# Patient Record
Sex: Female | Born: 1975 | Race: White | Hispanic: No | Marital: Married | Smoking: Current every day smoker
Health system: Southern US, Community
[De-identification: ages and names within clinical notes are randomized; demographics above are authoritative.]

## PROBLEM LIST (undated history)

## (undated) DIAGNOSIS — R112 Nausea with vomiting, unspecified: Secondary | ICD-10-CM

## (undated) DIAGNOSIS — D649 Anemia, unspecified: Secondary | ICD-10-CM

## (undated) DIAGNOSIS — F419 Anxiety disorder, unspecified: Secondary | ICD-10-CM

## (undated) DIAGNOSIS — F32A Depression, unspecified: Secondary | ICD-10-CM

## (undated) DIAGNOSIS — F329 Major depressive disorder, single episode, unspecified: Secondary | ICD-10-CM

## (undated) DIAGNOSIS — Z9889 Other specified postprocedural states: Secondary | ICD-10-CM

## (undated) DIAGNOSIS — J302 Other seasonal allergic rhinitis: Secondary | ICD-10-CM

## (undated) HISTORY — PX: DILATION AND CURETTAGE OF UTERUS: SHX78

---
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
## 2012-05-07 ENCOUNTER — Encounter: Payer: BC Managed Care – PPO | Attending: "Endocrinology | Admitting: *Deleted

## 2012-05-07 DIAGNOSIS — Z713 Dietary counseling and surveillance: Secondary | ICD-10-CM

## 2012-05-08 NOTE — Progress Notes (Signed)
Patient attended basic nutrition class on 05/07/12.  Topics covered include:   1. Complications of Hyperlipidemia and/or Hypertension. 2. Ways to reduce risk of heart disease.  3. Identifying fat and sodium content on food labels. 4. Ways to decrease sodium intake. 5. Optimal amount of daily saturated fat intake. 6. Optimal amount of daily sodium intake.  7. Foods to limit/avoid on a heart healthy diet. 8. MyPlate and portion control.   Patient to follow-up with [facility] prn.  

## 2012-11-22 ENCOUNTER — Encounter (HOSPITAL_BASED_OUTPATIENT_CLINIC_OR_DEPARTMENT_OTHER): Payer: Self-pay | Admitting: Emergency Medicine

## 2012-11-22 DIAGNOSIS — F17200 Nicotine dependence, unspecified, uncomplicated: Secondary | ICD-10-CM | POA: Insufficient documentation

## 2012-11-22 DIAGNOSIS — Z3202 Encounter for pregnancy test, result negative: Secondary | ICD-10-CM | POA: Insufficient documentation

## 2012-11-22 DIAGNOSIS — R109 Unspecified abdominal pain: Secondary | ICD-10-CM | POA: Insufficient documentation

## 2012-11-22 DIAGNOSIS — N898 Other specified noninflammatory disorders of vagina: Secondary | ICD-10-CM | POA: Insufficient documentation

## 2012-11-22 DIAGNOSIS — Z79899 Other long term (current) drug therapy: Secondary | ICD-10-CM | POA: Insufficient documentation

## 2012-11-22 NOTE — ED Notes (Signed)
Pt c/o right sided and suprapubic abd pain pain starting tonight. Pt reports vomiting x several episodes tonight.

## 2012-11-23 ENCOUNTER — Emergency Department (HOSPITAL_BASED_OUTPATIENT_CLINIC_OR_DEPARTMENT_OTHER)
Admission: EM | Admit: 2012-11-23 | Discharge: 2012-11-23 | Disposition: A | Discharge status: Home / Self Care | Payer: BC Managed Care – PPO | Attending: Emergency Medicine | Admitting: Emergency Medicine

## 2012-11-23 ENCOUNTER — Ambulatory Visit (HOSPITAL_BASED_OUTPATIENT_CLINIC_OR_DEPARTMENT_OTHER)
Admit: 2012-11-23 | Discharge: 2012-11-23 | Disposition: A | Discharge status: Home / Self Care | Payer: BC Managed Care – PPO | Attending: Emergency Medicine | Admitting: Emergency Medicine

## 2012-11-23 ENCOUNTER — Emergency Department (HOSPITAL_BASED_OUTPATIENT_CLINIC_OR_DEPARTMENT_OTHER): Payer: BC Managed Care – PPO

## 2012-11-23 DIAGNOSIS — R109 Unspecified abdominal pain: Secondary | ICD-10-CM

## 2012-11-23 LAB — URINALYSIS, ROUTINE W REFLEX MICROSCOPIC
Bilirubin Urine: NEGATIVE
Glucose, UA: NEGATIVE mg/dL
Hgb urine dipstick: NEGATIVE
Ketones, ur: NEGATIVE mg/dL
Leukocytes, UA: NEGATIVE
Nitrite: NEGATIVE
Protein, ur: NEGATIVE mg/dL
Specific Gravity, Urine: 1.027 (ref 1.005–1.030)
Urobilinogen, UA: 0.2 mg/dL (ref 0.0–1.0)
pH: 5 (ref 5.0–8.0)

## 2012-11-23 LAB — WET PREP, GENITAL
Clue Cells Wet Prep HPF POC: NONE SEEN
Trich, Wet Prep: NONE SEEN

## 2012-11-23 LAB — CBC WITH DIFFERENTIAL/PLATELET
Basophils Relative: 0 % (ref 0–1)
Eosinophils Absolute: 0.3 10*3/uL (ref 0.0–0.7)
Hemoglobin: 12.8 g/dL (ref 12.0–15.0)
Lymphs Abs: 3.1 10*3/uL (ref 0.7–4.0)
MCH: 29 pg (ref 26.0–34.0)
Monocytes Relative: 5 % (ref 3–12)
Neutro Abs: 9.3 10*3/uL — ABNORMAL HIGH (ref 1.7–7.7)
Neutrophils Relative %: 69 % (ref 43–77)
Platelets: 302 10*3/uL (ref 150–400)
RBC: 4.41 MIL/uL (ref 3.87–5.11)

## 2012-11-23 LAB — PREGNANCY, URINE: Preg Test, Ur: NEGATIVE

## 2012-11-23 LAB — BASIC METABOLIC PANEL
BUN: 12 mg/dL (ref 6–23)
Chloride: 101 mEq/L (ref 96–112)
GFR calc Af Amer: >90 mL/min (ref >90)
GFR calc non Af Amer: 81 mL/min — ABNORMAL LOW (ref >90)
Glucose, Bld: 104 mg/dL — ABNORMAL HIGH (ref 70–99)
Potassium: 4.2 mEq/L (ref 3.5–5.1)
Sodium: 137 mEq/L (ref 135–145)

## 2012-11-23 MED ORDER — FENTANYL CITRATE 0.05 MG/ML IJ SOLN
100.0000 ug | Freq: Once | INTRAMUSCULAR | Status: AC
  Administered 2012-11-23: 100 ug via INTRAVENOUS
  Filled 2012-11-23: qty 2

## 2012-11-23 MED ORDER — OXYCODONE-ACETAMINOPHEN 5-325 MG PO TABS: 1.0000 | ORAL_TABLET | Freq: Four times a day (QID) | ORAL | Status: DC | PRN

## 2012-11-23 MED ORDER — IOHEXOL 300 MG/ML  SOLN
100.0000 mL | Freq: Once | INTRAMUSCULAR | Status: AC | PRN
  Administered 2012-11-23: 100 mL via INTRAVENOUS

## 2012-11-23 MED ORDER — IOHEXOL 300 MG/ML  SOLN
50.0000 mL | Freq: Once | INTRAMUSCULAR | Status: AC | PRN
  Administered 2012-11-23: 50 mL via ORAL

## 2012-11-23 NOTE — ED Provider Notes (Addendum)
History     CSN: [id number]  Arrival date & time 11/22/12  2355   First MD Initiated Contact with Patient 11/23/12 0028      Chief Complaint  Patient presents with  . Abdominal Pain    (Consider location/radiation/quality/duration/timing/severity/associated sxs/prior treatment) Patient is a 36 y.o. female presenting with abdominal pain. The history is provided by the patient.  Abdominal Pain The primary symptoms of the illness include abdominal pain. The primary symptoms of the illness do not include nausea or vomiting. The current episode started 1 to 2 hours ago (pain started then got worse and then has gotten a little better on own). The onset of the illness was sudden. The problem has not changed since onset. The abdominal pain began 1 to 2 hours ago. The pain came on suddenly. The abdominal pain has been unchanged since its onset. The abdominal pain is located in the RLQ and right flank. The abdominal pain does not radiate. The severity of the abdominal pain is 7/10 (started got worse now some improved). The abdominal pain is relieved by nothing. Exacerbated by: nothing.  The patient states that she believes she is currently not pregnant. The patient has not had a change in bowel habit. Risk factors for an acute abdominal problem include a history of abdominal surgery. Symptoms associated with the illness do not include chills. Significant associated medical issues do not include inflammatory bowel disease.    History reviewed. No pertinent past medical history.  Past Surgical History  Procedure Date  . Cesarean section     No family history on file.  History  Substance Use Topics  . Smoking status: Current Every Day Smoker  . Smokeless tobacco: Not on file  . Alcohol Use: No    OB History    Grav Para Term Preterm Abortions TAB SAB Ect Mult Living                  Review of Systems  Constitutional: Negative for chills.  Cardiovascular: Negative for chest pain.   Gastrointestinal: Positive for abdominal pain. Negative for nausea and vomiting.  All other systems reviewed and are negative.    Allergies  Augmentin; Penicillins; and Zyrtec  Home Medications   Current Outpatient Rx  Name  Route  Sig  Dispense  Refill  . CITALOPRAM HYDROBROMIDE 20 MG PO TABS   Oral   Take 20 mg by mouth daily.         Marland Kitchen MONTELUKAST SODIUM 10 MG PO TABS   Oral   Take 10 mg by mouth at bedtime.         . NORETHINDRONE ACET-ETHINYL EST 1-20 MG-MCG PO TABS   Oral   Take 1 tablet by mouth daily.           BP 142/85  Pulse 95  Temp 98.8 F (37.1 C) (Oral)  Resp 20  Ht 5' 3.5" (1.613 m)  Wt 230 lb (104.327 kg)  BMI 40.10 kg/m2  SpO2 97%  Physical Exam  Constitutional: She is oriented to person, place, and time. She appears well-developed and well-nourished. No distress.       Not writhing in room at time of exam.    HENT:  Head: Normocephalic and atraumatic.  Mouth/Throat: Oropharynx is clear and moist.  Eyes: Conjunctivae normal are normal. Pupils are equal, round, and reactive to light.  Neck: Normal range of motion. Neck supple.  Cardiovascular: Normal rate, regular rhythm and intact distal pulses.   Pulmonary/Chest: Effort normal and  breath sounds normal. She has no wheezes. She has no rales.  Abdominal: Soft. Bowel sounds are normal. She exhibits no mass. There is tenderness. There is no rebound and no guarding.  Genitourinary: Right adnexum displays no mass and no tenderness. Left adnexum displays no mass and no tenderness. Vaginal discharge found.       No CMT  Musculoskeletal: Normal range of motion. She exhibits no edema.  Neurological: She is alert and oriented to person, place, and time.  Skin: Skin is warm and dry.  Psychiatric: She has a normal mood and affect.    ED Course  Procedures (including critical care time)  Labs Reviewed  WET PREP, GENITAL - Abnormal; Notable for the following:    WBC, Wet Prep HPF POC FEW (*)      All other components within normal limits  URINALYSIS, ROUTINE W REFLEX MICROSCOPIC  PREGNANCY, URINE  CBC WITH DIFFERENTIAL  BASIC METABOLIC PANEL  GC/CHLAMYDIA PROBE AMP   No results found.   No diagnosis found.    MDM  Suspect cyst rupture given presentation and exam torsion extremely unlikely  255 case d/w Dr. Despina Hidden.  Not torsion based on exam and CT findings.  No indication for transfer nor emergent ultrasound.  Should have seen specific findings on CT scan.    And patient should be writhing in pain on exam.    Will schedule outpatient pelvic US.  Patient is amenable to same.  Currently feeling pain free return to the closest ED immediately for worsening symptoms.  Patient verbalizes understanding and agrees to follow up     [place] K Dan Dissinger-Rasch, MD 11/23/12 0328  Perrion Diesel K Matrice Herro-Rasch, MD 11/23/12 [phone]

## 2012-11-23 NOTE — ED Notes (Signed)
Pt. Reports the pain was so intense at her home that she vomited.

## 2017-07-30 ENCOUNTER — Other Ambulatory Visit: Payer: Self-pay | Admitting: Family Medicine

## 2017-07-30 ENCOUNTER — Ambulatory Visit
Admission: RE | Admit: 2017-07-30 | Discharge: 2017-07-30 | Disposition: A | Discharge status: Home / Self Care | Payer: BLUE CROSS/BLUE SHIELD | Source: Ambulatory Visit | Attending: Family Medicine | Admitting: Family Medicine

## 2017-07-30 DIAGNOSIS — R059 Cough, unspecified: Secondary | ICD-10-CM

## 2017-07-30 DIAGNOSIS — R0602 Shortness of breath: Secondary | ICD-10-CM

## 2017-07-30 DIAGNOSIS — R05 Cough: Secondary | ICD-10-CM

## 2017-09-04 ENCOUNTER — Other Ambulatory Visit: Payer: Self-pay | Admitting: Obstetrics and Gynecology

## 2017-09-04 DIAGNOSIS — N6320 Unspecified lump in the left breast, unspecified quadrant: Secondary | ICD-10-CM

## 2017-09-07 ENCOUNTER — Ambulatory Visit
Admission: RE | Admit: 2017-09-07 | Discharge: 2017-09-07 | Disposition: A | Discharge status: Home / Self Care | Payer: BLUE CROSS/BLUE SHIELD | Source: Ambulatory Visit | Attending: Obstetrics and Gynecology | Admitting: Obstetrics and Gynecology

## 2017-09-07 DIAGNOSIS — N6320 Unspecified lump in the left breast, unspecified quadrant: Secondary | ICD-10-CM

## 2017-10-04 HISTORY — PX: ABLATION: SHX5711

## 2018-05-26 IMAGING — US ULTRASOUND LEFT BREAST LIMITED
1 series · 4 of 4 positions shown · non-contrast
Comparison: Previous exam(s).

CLINICAL DATA: Mass felt by the patient in the 2 o'clock position
of the left breast for the past 2 months, unchanged during that
time.

EXAM:
2D DIGITAL DIAGNOSTIC BILATERAL MAMMOGRAM WITH CAD AND ADJUNCT TOMO
ULTRASOUND LEFT BREAST

[Series 1: ultrasound left breast limited · 0.04mm/px · 4 of 4 slices shown]
[im 1/4]
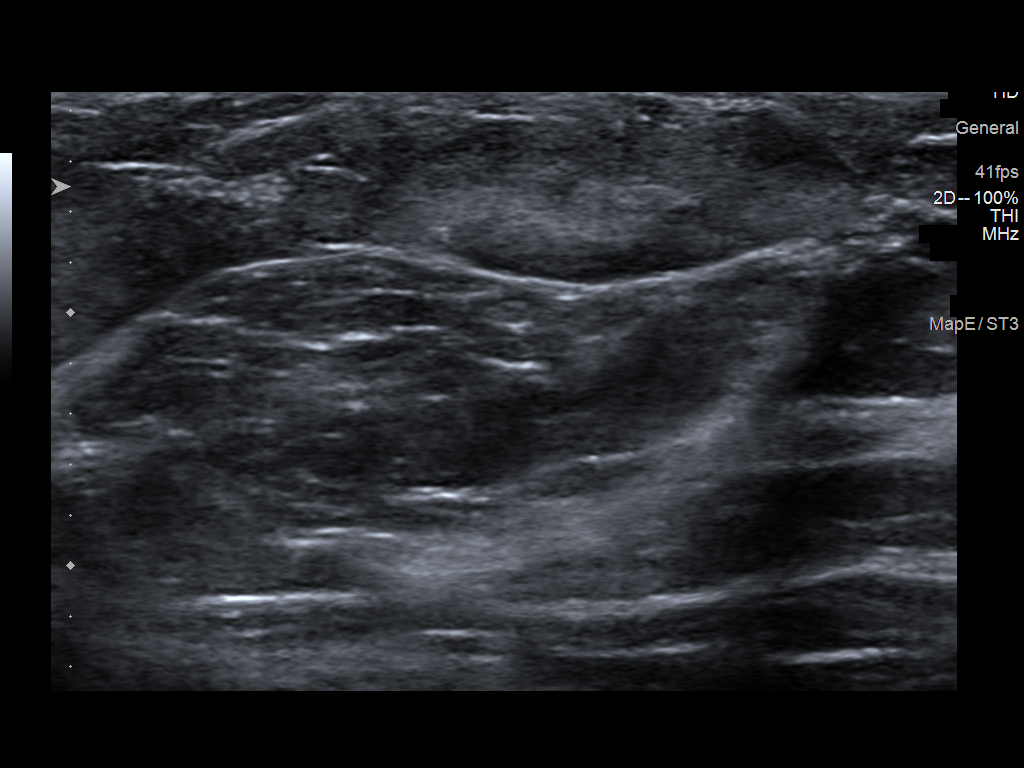
[im 2/4]
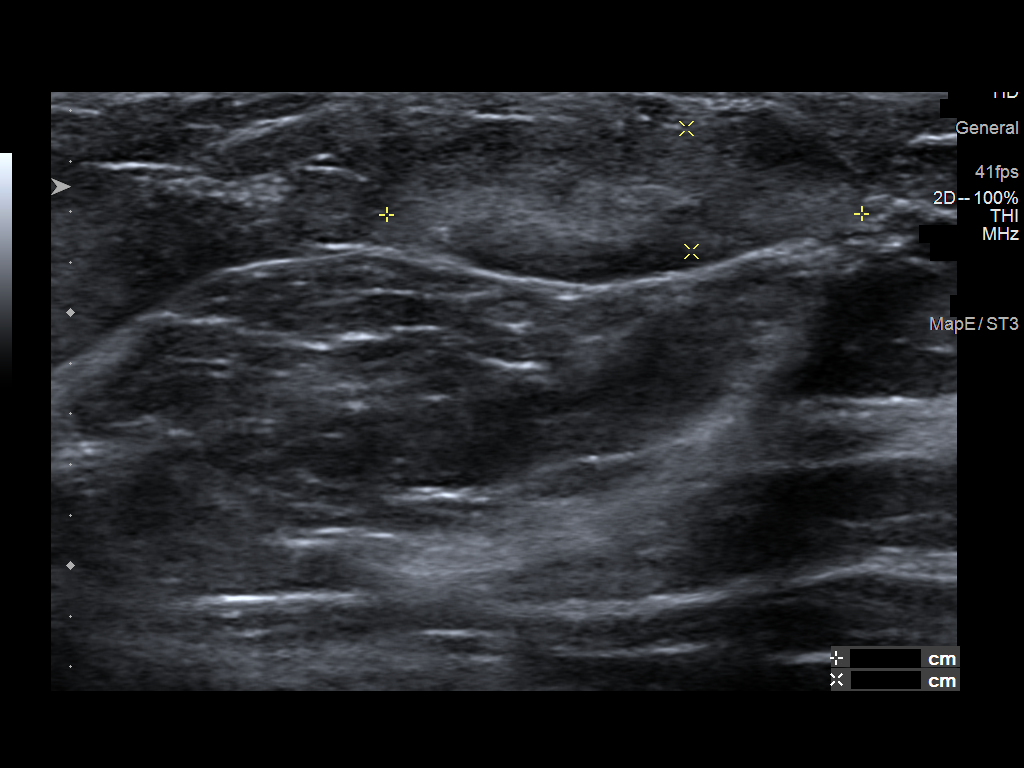
[im 3/4]
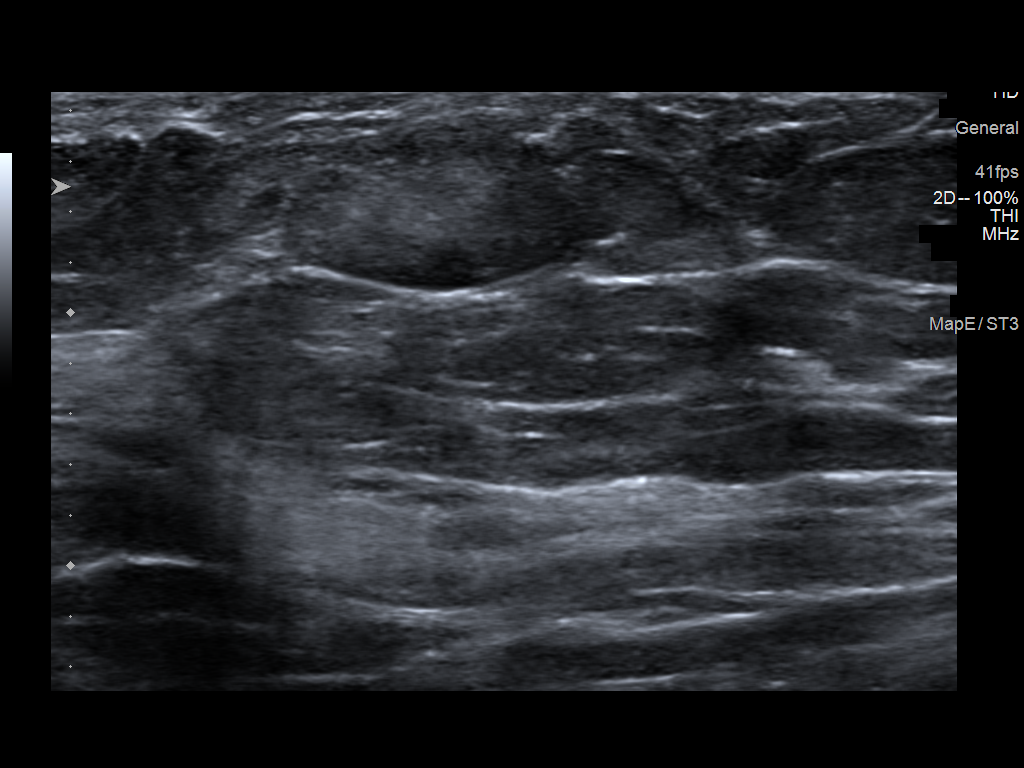
[im 4/4]
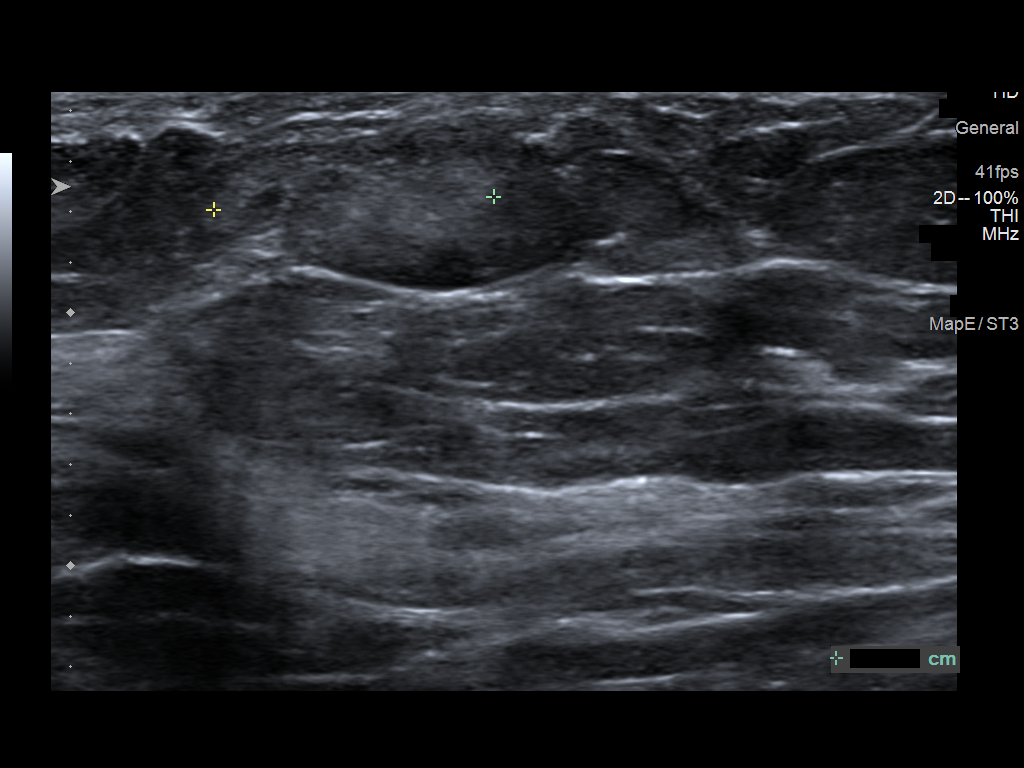

[4 of 4 positions shown; findings below may reference images not displayed]

ACR Breast Density Category c: The breast tissue is heterogeneously
dense, which may obscure small masses.
FINDINGS: Stable mammographic appearance of the breasts with no mass or other
findings suspicious for malignancy in either breast.

Mammographic images were processed with CAD.

On physical exam, the patient has an approximately 8 mm oval,
mass-like area of palpable soft tissue thickening in the 2 o'clock
position of the left breast, 10 cm from the nipple.

Targeted ultrasound is performed, showing an area of ill-defined
increased echogenicity within a subcutaneous fat lobule in the 2
o'clock position of the left breast, 10 cm from the nipple. This
measures approximately 1.9 x 1.1 x 0.5 cm in maximum dimensions.
IMPRESSION: 1.9 cm area of fat necrosis within a subcutaneous fat lobule in the
2 o'clock position of the left breast. No evidence of malignancy in
either breast.

RECOMMENDATION:
Bilateral screening mammogram in 1 year.

I have discussed the findings and recommendations with the patient.
Results were also provided in writing at the conclusion of the
visit. If applicable, a reminder letter will be sent to the patient
regarding the next appointment.

BI-RADS CATEGORY  2: Benign.

## 2018-06-12 NOTE — Patient Instructions (Addendum)
Your procedure is scheduled on:  Thursday, July 25  Enter through the Main Entrance of [facility] at:  6 am  Pick up the phone at the desk and dial [phone].  Call this number if you have problems the morning of surgery: [phone].  Remember: Do NOT eat food or Do NOT drink clear liquids (including water) after midnight Wednesday.  Take these medicines the morning of surgery with a SIP OF WATER:  Flonase nasal spray if needed  Bruch you teeth on the morning of surgery.    Do Not smoke on the day of surgery.  Stop herbal medications, vitamin supplements, Ibuprofen/NSAIDS 1 week prior to surgery.  (Starting Friday, 7/19)  Do NOT wear jewelry (body piercing), metal hair clips/bobby pins, make-up, or nail polish. Do NOT wear lotions, powders, or perfumes.  You may wear deoderant. Do NOT shave for 48 hours prior to surgery. Do NOT bring valuables to the hospital.  Leave suitcase in car.  After surgery it may be brought to your room.  For patients admitted to the hospital, checkout time is 11:00 AM the day of discharge. Home with Husband Minerva Areolaric cell [phone].

## 2018-06-13 NOTE — H&P (Signed)
Jacki ConesShelly L Coccia  DICTATION # [id number] CSN# [id number]   Meriel Picaichard M Brita Jurgensen, MD 06/13/2018 3:17 PM

## 2018-06-14 ENCOUNTER — Other Ambulatory Visit: Payer: Self-pay

## 2018-06-14 ENCOUNTER — Encounter (HOSPITAL_COMMUNITY)
Admission: RE | Admit: 2018-06-14 | Discharge: 2018-06-14 | Disposition: A | Discharge status: Home / Self Care | Payer: BLUE CROSS/BLUE SHIELD | Source: Ambulatory Visit | Attending: Obstetrics and Gynecology | Admitting: Obstetrics and Gynecology

## 2018-06-14 ENCOUNTER — Encounter (HOSPITAL_COMMUNITY): Payer: Self-pay

## 2018-06-14 DIAGNOSIS — Z01812 Encounter for preprocedural laboratory examination: Secondary | ICD-10-CM | POA: Diagnosis not present

## 2018-06-14 HISTORY — DX: Anxiety disorder, unspecified: F41.9

## 2018-06-14 HISTORY — DX: Nausea with vomiting, unspecified: R11.2

## 2018-06-14 HISTORY — DX: Major depressive disorder, single episode, unspecified: F32.9

## 2018-06-14 HISTORY — DX: Depression, unspecified: F32.A

## 2018-06-14 HISTORY — DX: Anemia, unspecified: D64.9

## 2018-06-14 HISTORY — DX: Other specified postprocedural states: Z98.890

## 2018-06-14 HISTORY — DX: Other seasonal allergic rhinitis: J30.2

## 2018-06-14 LAB — CBC
HEMATOCRIT: 38.9 % (ref 36.0–46.0)
HEMOGLOBIN: 12.7 g/dL (ref 12.0–15.0)
MCH: 26.8 pg (ref 26.0–34.0)
MCHC: 32.6 g/dL (ref 30.0–36.0)
MCV: 82.2 fL (ref 78.0–100.0)
Platelets: 324 10*3/uL (ref 150–400)
RBC: 4.73 MIL/uL (ref 3.87–5.11)
RDW: 16.8 % — ABNORMAL HIGH (ref 11.5–15.5)
WBC: 12 10*3/uL — ABNORMAL HIGH (ref 4.0–10.5)

## 2018-06-14 LAB — TYPE AND SCREEN
ABO/RH(D): A POS
Antibody Screen: NEGATIVE

## 2018-06-14 LAB — ABO/RH: ABO/RH(D): A POS

## 2018-06-14 NOTE — H&P (Signed)
NAME: Andrea Hunt, Andrea L. MEDICAL RECORD UY:[id number] ACCOUNT [id number] DATE OF BIRTH:Jan 23, 1976 FACILITY: [facility] LOCATION: [facility] PHYSICIAN:Andrea Serres Milana ObeyM. Laverta Harnisch, MD  HISTORY AND PHYSICAL  DATE OF ADMISSION:  06/27/2018  CHIEF COMPLAINT:  Dysmenorrhea, pelvic pain, menorrhagia.  HISTORY OF PRESENT ILLNESS:  This is a 42 year old G2, P2.  This husband has had a vasectomy.  Was evaluated last year for a history of menorrhagia.  Ultrasound dated, 09/20/2017 demonstrated possible adenomyosis based on thickness of the myometrium, but  no other abnormalities.  She subsequently underwent NovaSure endometrial ablation 10/2017.  Pathology from the [facility] at that time was negative.  She did well initially, but presents now with complaints of continued menorrhagia and increased cramps  that have required higher dose Motrin or even IM Toradol without significant improvement.  Due to continued problems, she presents now for definitive, LAVH bilateral salpingectomy.  This procedure including specifics regarding bleeding, infection,  transfusion, wound infection, phlebitis, adjacent organ injury, expected recovery time discussed.  The rationale for a bilateral salpingectomy reviewed also.  The plan will be to conserve her otherwise normal appearing ovaries.  The possibility of having  to complete surgery by open technique all discussed with her, which she understands and accepts.  ALLERGIES:  PENICILLIN.  CURRENT MEDICATIONS:  Singulair, citalopram, fluticasone nasal spray.  PAST SURGICAL HISTORY:  She has had 2 C-sections and also endometrial ablation.  FAMILY HISTORY:  Significant for UTI and arthritis.  SOCIAL HISTORY:  She does smoke 1/4 PPD, drinks alcohol socially.  She is married . Dr. Arlyce DiceKaplan in [place], as her PCP.  Denies drug use.  Last Pap 09/2017 was normal.  PHYSICAL EXAMINATION: VITAL SIGNS:  Temperature 98.2, blood pressure 120/78. HEENT:  Unremarkable. NECK:  Supple, without  masses. LUNGS:  Clear. HEART:  Regular rate and rhythm without murmurs, rubs or gallops. BREASTS:  Without masses. ABDOMEN:  Soft, flat, nontender.  Vulva, vagina, cervix normal.  Uterus mid position, normal size.  Adnexa negative.  Uterus is mobile. NEUROLOGIC:  Unremarkable.  IMPRESSION:  Probable adenomyosis, persistent dysmenorrhea, pelvic pain and menorrhagia post-ablation.  PLAN:  LVAH, bilateral salpingectomy.  Procedure and risks discussed as above.  AN/NUANCE  D:06/13/2018 T:06/13/2018 JOB:[id number]

## 2018-06-26 NOTE — Anesthesia Preprocedure Evaluation (Addendum)
Anesthesia Evaluation  Patient identified by MRN, date of birth, ID band Patient awake    Reviewed: Allergy & Precautions, NPO status , Patient's Chart, lab work & pertinent test results  History of Anesthesia Complications (+) PONV  Airway Mallampati: II  TM Distance: >3 FB Neck ROM: Full    Dental no notable dental hx. (+) Teeth Intact, Dental Advisory Given   Pulmonary Current Smoker,    Pulmonary exam normal breath sounds clear to auscultation       Cardiovascular negative cardio ROS Normal cardiovascular exam Rhythm:Regular Rate:Normal     Neuro/Psych Anxiety Depression    GI/Hepatic negative GI ROS, Neg liver ROS,   Endo/Other    Renal/GU      Musculoskeletal   Abdominal (+) + obese,   Peds  Hematology  (+) anemia ,   Anesthesia Other Findings   Reproductive/Obstetrics negative OB ROS                            Lab Results  Component Value Date   WBC 12.0 (H) 06/14/2018   HGB 12.7 06/14/2018   HCT 38.9 06/14/2018   MCV 82.2 06/14/2018   PLT 324 06/14/2018    Anesthesia Physical Anesthesia Plan  ASA: II  Anesthesia Plan: General   Post-op Pain Management:    Induction: Intravenous  PONV Risk Score and Plan: 3 and Scopolamine patch - Pre-op, Dexamethasone, Ondansetron and Treatment may vary due to age or medical condition  Airway Management Planned: Oral ETT  Additional Equipment:   Intra-op Plan:   Post-operative Plan: Extubation in OR  Informed Consent: I have reviewed the patients History and Physical, chart, labs and discussed the procedure including the risks, benefits and alternatives for the proposed anesthesia with the patient or authorized representative who has indicated his/her understanding and acceptance.   Dental advisory given  Plan Discussed with: CRNA  Anesthesia Plan Comments:        Anesthesia Quick Evaluation

## 2018-06-27 ENCOUNTER — Ambulatory Visit (HOSPITAL_COMMUNITY): Payer: BLUE CROSS/BLUE SHIELD | Admitting: Anesthesiology

## 2018-06-27 ENCOUNTER — Encounter (HOSPITAL_COMMUNITY): Payer: Self-pay | Admitting: Anesthesiology

## 2018-06-27 ENCOUNTER — Encounter (HOSPITAL_COMMUNITY)
Admission: RE | Disposition: A | Discharge status: Home / Self Care | Payer: Self-pay | Source: Home / Self Care | Attending: Obstetrics and Gynecology

## 2018-06-27 ENCOUNTER — Inpatient Hospital Stay (HOSPITAL_COMMUNITY)
Admission: RE | Admit: 2018-06-27 | Discharge: 2018-06-29 | DRG: 743 | Disposition: A | Discharge status: Home / Self Care | Payer: BLUE CROSS/BLUE SHIELD | Attending: Obstetrics and Gynecology | Admitting: Obstetrics and Gynecology

## 2018-06-27 ENCOUNTER — Other Ambulatory Visit: Payer: Self-pay

## 2018-06-27 DIAGNOSIS — Z880 Allergy status to penicillin: Secondary | ICD-10-CM

## 2018-06-27 DIAGNOSIS — N946 Dysmenorrhea, unspecified: Principal | ICD-10-CM | POA: Diagnosis present

## 2018-06-27 DIAGNOSIS — N800 Endometriosis of uterus: Secondary | ICD-10-CM | POA: Diagnosis present

## 2018-06-27 DIAGNOSIS — R102 Pelvic and perineal pain: Secondary | ICD-10-CM | POA: Diagnosis present

## 2018-06-27 DIAGNOSIS — F17210 Nicotine dependence, cigarettes, uncomplicated: Secondary | ICD-10-CM | POA: Diagnosis present

## 2018-06-27 DIAGNOSIS — D649 Anemia, unspecified: Secondary | ICD-10-CM | POA: Diagnosis present

## 2018-06-27 DIAGNOSIS — N920 Excessive and frequent menstruation with regular cycle: Secondary | ICD-10-CM | POA: Diagnosis present

## 2018-06-27 DIAGNOSIS — Z5331 Laparoscopic surgical procedure converted to open procedure: Secondary | ICD-10-CM

## 2018-06-27 HISTORY — PX: HYSTERECTOMY ABDOMINAL WITH SALPINGECTOMY: SHX6725

## 2018-06-27 HISTORY — PX: LAPAROSCOPY: SHX197

## 2018-06-27 LAB — PREGNANCY, URINE: Preg Test, Ur: NEGATIVE

## 2018-06-27 SURGERY — LAPAROSCOPY, DIAGNOSTIC
Anesthesia: General | Site: Abdomen

## 2018-06-27 MED ORDER — HYDROCODONE-ACETAMINOPHEN 7.5-325 MG PO TABS: 1.0000 | ORAL_TABLET | Freq: Once | ORAL | Status: DC | PRN

## 2018-06-27 MED ORDER — IBUPROFEN 800 MG PO TABS: 800.0000 mg | ORAL_TABLET | Freq: Three times a day (TID) | ORAL | Status: DC | PRN

## 2018-06-27 MED ORDER — MIDAZOLAM HCL 2 MG/2ML IJ SOLN
INTRAMUSCULAR | Status: DC | PRN
  Administered 2018-06-27: 2 mg via INTRAVENOUS

## 2018-06-27 MED ORDER — DIPHENHYDRAMINE HCL 12.5 MG/5ML PO ELIX: 12.5000 mg | ORAL_SOLUTION | Freq: Four times a day (QID) | ORAL | Status: DC | PRN

## 2018-06-27 MED ORDER — BUTORPHANOL TARTRATE 1 MG/ML IJ SOLN
1.0000 mg | INTRAMUSCULAR | Status: DC | PRN
  Administered 2018-06-28: 1 mg via INTRAVENOUS
  Filled 2018-06-27: qty 1

## 2018-06-27 MED ORDER — BUPIVACAINE HCL (PF) 0.25 % IJ SOLN
INTRAMUSCULAR | Status: AC
  Filled 2018-06-27: qty 30

## 2018-06-27 MED ORDER — PROPOFOL 10 MG/ML IV BOLUS
INTRAVENOUS | Status: DC | PRN
  Administered 2018-06-27: 200 mg via INTRAVENOUS

## 2018-06-27 MED ORDER — DEXAMETHASONE SODIUM PHOSPHATE 4 MG/ML IJ SOLN
INTRAMUSCULAR | Status: AC
  Filled 2018-06-27: qty 1

## 2018-06-27 MED ORDER — SCOPOLAMINE 1 MG/3DAYS TD PT72
1.0000 | MEDICATED_PATCH | Freq: Once | TRANSDERMAL | Status: DC
  Administered 2018-06-27: 1.5 mg via TRANSDERMAL

## 2018-06-27 MED ORDER — ONDANSETRON HCL 4 MG/2ML IJ SOLN
INTRAMUSCULAR | Status: DC | PRN
  Administered 2018-06-27: 4 mg via INTRAVENOUS

## 2018-06-27 MED ORDER — FENTANYL CITRATE (PF) 250 MCG/5ML IJ SOLN
INTRAMUSCULAR | Status: AC
  Filled 2018-06-27: qty 5

## 2018-06-27 MED ORDER — MORPHINE SULFATE 2 MG/ML IV SOLN
INTRAVENOUS | Status: DC
  Administered 2018-06-27: 12 mg via INTRAVENOUS
  Administered 2018-06-27: 13:00:00 via INTRAVENOUS
  Filled 2018-06-27 (×2): qty 25

## 2018-06-27 MED ORDER — MEPERIDINE HCL 25 MG/ML IJ SOLN: 6.2500 mg | INTRAMUSCULAR | Status: DC | PRN

## 2018-06-27 MED ORDER — MENTHOL 3 MG MT LOZG: 1.0000 | LOZENGE | OROMUCOSAL | Status: DC | PRN

## 2018-06-27 MED ORDER — ONDANSETRON HCL 4 MG/2ML IJ SOLN: 4.0000 mg | Freq: Four times a day (QID) | INTRAMUSCULAR | Status: DC | PRN

## 2018-06-27 MED ORDER — KETOROLAC TROMETHAMINE 30 MG/ML IJ SOLN
30.0000 mg | Freq: Once | INTRAMUSCULAR | Status: AC
  Administered 2018-06-27: 30 mg via INTRAVENOUS

## 2018-06-27 MED ORDER — HYDROMORPHONE HCL 1 MG/ML IJ SOLN
INTRAMUSCULAR | Status: AC
  Administered 2018-06-27: 0.5 mg via INTRAVENOUS
  Filled 2018-06-27: qty 1

## 2018-06-27 MED ORDER — ROCURONIUM BROMIDE 100 MG/10ML IV SOLN
INTRAVENOUS | Status: AC
  Filled 2018-06-27: qty 1

## 2018-06-27 MED ORDER — SCOPOLAMINE 1 MG/3DAYS TD PT72
MEDICATED_PATCH | TRANSDERMAL | Status: AC
  Filled 2018-06-27: qty 1

## 2018-06-27 MED ORDER — BUPIVACAINE LIPOSOME 1.3 % IJ SUSP
INTRAMUSCULAR | Status: DC | PRN
  Administered 2018-06-27: 57 mL

## 2018-06-27 MED ORDER — LIDOCAINE HCL (CARDIAC) PF 100 MG/5ML IV SOSY
PREFILLED_SYRINGE | INTRAVENOUS | Status: AC
  Filled 2018-06-27: qty 5

## 2018-06-27 MED ORDER — SODIUM CHLORIDE 0.9 % IJ SOLN
INTRAMUSCULAR | Status: AC
  Filled 2018-06-27: qty 10

## 2018-06-27 MED ORDER — DIPHENHYDRAMINE HCL 50 MG/ML IJ SOLN: 12.5000 mg | Freq: Four times a day (QID) | INTRAMUSCULAR | Status: DC | PRN

## 2018-06-27 MED ORDER — ROCURONIUM BROMIDE 100 MG/10ML IV SOLN
INTRAVENOUS | Status: DC | PRN
  Administered 2018-06-27: 10 mg via INTRAVENOUS
  Administered 2018-06-27: 50 mg via INTRAVENOUS

## 2018-06-27 MED ORDER — KETOROLAC TROMETHAMINE 30 MG/ML IJ SOLN
30.0000 mg | Freq: Four times a day (QID) | INTRAMUSCULAR | Status: DC
  Administered 2018-06-27 – 2018-06-29 (×7): 30 mg via INTRAVENOUS
  Filled 2018-06-27 (×7): qty 1

## 2018-06-27 MED ORDER — FLUTICASONE PROPIONATE 50 MCG/ACT NA SUSP
1.0000 | Freq: Every day | NASAL | Status: DC
  Filled 2018-06-27: qty 16

## 2018-06-27 MED ORDER — KETOROLAC TROMETHAMINE 30 MG/ML IJ SOLN: 30.0000 mg | Freq: Four times a day (QID) | INTRAMUSCULAR | Status: DC

## 2018-06-27 MED ORDER — SODIUM CHLORIDE 0.9 % IV SOLN: 20.0000 mL | Freq: Once | INTRAVENOUS | Status: DC

## 2018-06-27 MED ORDER — SODIUM CHLORIDE 0.9% FLUSH: 9.0000 mL | INTRAVENOUS | Status: DC | PRN

## 2018-06-27 MED ORDER — MIDAZOLAM HCL 2 MG/2ML IJ SOLN
INTRAMUSCULAR | Status: AC
  Filled 2018-06-27: qty 2

## 2018-06-27 MED ORDER — SODIUM CHLORIDE 0.9 % IJ SOLN
INTRAMUSCULAR | Status: AC
  Filled 2018-06-27: qty 20

## 2018-06-27 MED ORDER — KETOROLAC TROMETHAMINE 30 MG/ML IJ SOLN
INTRAMUSCULAR | Status: AC
  Administered 2018-06-27: 30 mg via INTRAVENOUS
  Filled 2018-06-27: qty 1

## 2018-06-27 MED ORDER — PROMETHAZINE HCL 25 MG/ML IJ SOLN: 6.2500 mg | INTRAMUSCULAR | Status: DC | PRN

## 2018-06-27 MED ORDER — DEXAMETHASONE SODIUM PHOSPHATE 10 MG/ML IJ SOLN
INTRAMUSCULAR | Status: DC | PRN
  Administered 2018-06-27: 10 mg via INTRAVENOUS

## 2018-06-27 MED ORDER — FENTANYL CITRATE (PF) 100 MCG/2ML IJ SOLN
INTRAMUSCULAR | Status: DC | PRN
  Administered 2018-06-27 (×3): 100 ug via INTRAVENOUS
  Administered 2018-06-27: 50 ug via INTRAVENOUS

## 2018-06-27 MED ORDER — MONTELUKAST SODIUM 10 MG PO TABS
10.0000 mg | ORAL_TABLET | Freq: Every day | ORAL | Status: DC
  Administered 2018-06-27 – 2018-06-28 (×2): 10 mg via ORAL
  Filled 2018-06-27 (×2): qty 1

## 2018-06-27 MED ORDER — LIDOCAINE HCL (CARDIAC) PF 100 MG/5ML IV SOSY
PREFILLED_SYRINGE | INTRAVENOUS | Status: DC | PRN
  Administered 2018-06-27: 30 mg via INTRAVENOUS

## 2018-06-27 MED ORDER — DEXTROSE IN LACTATED RINGERS 5 % IV SOLN
INTRAVENOUS | Status: DC
  Administered 2018-06-27 – 2018-06-28 (×2): via INTRAVENOUS

## 2018-06-27 MED ORDER — NALOXONE HCL 0.4 MG/ML IJ SOLN: 0.4000 mg | INTRAMUSCULAR | Status: DC | PRN

## 2018-06-27 MED ORDER — PROPOFOL 10 MG/ML IV BOLUS
INTRAVENOUS | Status: AC
  Filled 2018-06-27: qty 20

## 2018-06-27 MED ORDER — GABAPENTIN 300 MG PO CAPS
300.0000 mg | ORAL_CAPSULE | Freq: Once | ORAL | Status: AC
  Administered 2018-06-27: 300 mg via ORAL

## 2018-06-27 MED ORDER — LACTATED RINGERS IV SOLN
INTRAVENOUS | Status: DC
  Administered 2018-06-27: 10:00:00 via INTRAVENOUS
  Administered 2018-06-27: 100 mL/h via INTRAVENOUS

## 2018-06-27 MED ORDER — SUGAMMADEX SODIUM 200 MG/2ML IV SOLN
INTRAVENOUS | Status: DC | PRN
  Administered 2018-06-27: 200 mg via INTRAVENOUS

## 2018-06-27 MED ORDER — BUPIVACAINE LIPOSOME 1.3 % IJ SUSP
20.0000 mL | Freq: Once | INTRAMUSCULAR | Status: DC
  Filled 2018-06-27: qty 20

## 2018-06-27 MED ORDER — GENTAMICIN SULFATE 40 MG/ML IJ SOLN
INTRAVENOUS | Status: AC
  Administered 2018-06-27: 100 mg via INTRAVENOUS
  Filled 2018-06-27: qty 13.25

## 2018-06-27 MED ORDER — BUPIVACAINE HCL (PF) 0.25 % IJ SOLN
INTRAMUSCULAR | Status: DC | PRN
  Administered 2018-06-27: 8 mL

## 2018-06-27 MED ORDER — ACETAMINOPHEN 10 MG/ML IV SOLN: 1000.0000 mg | Freq: Once | INTRAVENOUS | Status: DC | PRN

## 2018-06-27 MED ORDER — HYDROMORPHONE HCL 1 MG/ML IJ SOLN
0.2500 mg | INTRAMUSCULAR | Status: DC | PRN
  Administered 2018-06-27 (×5): 0.5 mg via INTRAVENOUS

## 2018-06-27 MED ORDER — CITALOPRAM HYDROBROMIDE 20 MG PO TABS
20.0000 mg | ORAL_TABLET | Freq: Every day | ORAL | Status: DC
  Administered 2018-06-27 – 2018-06-28 (×2): 20 mg via ORAL
  Filled 2018-06-27 (×2): qty 1

## 2018-06-27 MED ORDER — GABAPENTIN 300 MG PO CAPS
ORAL_CAPSULE | ORAL | Status: AC
  Filled 2018-06-27: qty 1

## 2018-06-27 MED ORDER — ONDANSETRON HCL 4 MG PO TABS: 4.0000 mg | ORAL_TABLET | Freq: Four times a day (QID) | ORAL | Status: DC | PRN

## 2018-06-27 MED ORDER — ONDANSETRON HCL 4 MG/2ML IJ SOLN
INTRAMUSCULAR | Status: AC
  Filled 2018-06-27: qty 2

## 2018-06-27 MED ORDER — HYDROMORPHONE HCL 1 MG/ML IJ SOLN
INTRAMUSCULAR | Status: DC | PRN
  Administered 2018-06-27: 1 mg via INTRAVENOUS

## 2018-06-27 MED ORDER — SODIUM CHLORIDE 0.9 % IR SOLN
Status: DC | PRN
  Administered 2018-06-27: 1000 mL

## 2018-06-27 SURGICAL SUPPLY — 50 items
CABLE HIGH FREQUENCY MONO STRZ (ELECTRODE) IMPLANT
CANISTER SUCT 3000ML PPV (MISCELLANEOUS) ×5 IMPLANT
CATH ROBINSON RED A/P 16FR (CATHETERS) ×5 IMPLANT
CONT PATH 16OZ SNAP LID 3702 (MISCELLANEOUS) ×5 IMPLANT
COVER BACK TABLE 60X90IN (DRAPES) ×5 IMPLANT
COVER MAYO STAND STRL (DRAPES) ×5 IMPLANT
DECANTER SPIKE VIAL GLASS SM (MISCELLANEOUS) ×6 IMPLANT
DERMABOND ADVANCED (GAUZE/BANDAGES/DRESSINGS) ×2
DERMABOND ADVANCED .7 DNX12 (GAUZE/BANDAGES/DRESSINGS) ×3 IMPLANT
DRSG OPSITE POSTOP 3X4 (GAUZE/BANDAGES/DRESSINGS) ×3 IMPLANT
DRSG OPSITE POSTOP 4X10 (GAUZE/BANDAGES/DRESSINGS) ×3 IMPLANT
DURAPREP 26ML APPLICATOR (WOUND CARE) ×5 IMPLANT
ELECT REM PT RETURN 9FT ADLT (ELECTROSURGICAL) ×5
ELECTRODE REM PT RTRN 9FT ADLT (ELECTROSURGICAL) ×3 IMPLANT
GLOVE BIO SURGEON STRL SZ7 (GLOVE) ×10 IMPLANT
GLOVE BIOGEL PI IND STRL 6.5 (GLOVE) ×4 IMPLANT
GLOVE BIOGEL PI IND STRL 7.0 (GLOVE) ×6 IMPLANT
GLOVE BIOGEL PI INDICATOR 6.5 (GLOVE) ×4
GLOVE BIOGEL PI INDICATOR 7.0 (GLOVE) ×4
LEGGING LITHOTOMY PAIR STRL (DRAPES) ×5 IMPLANT
LIGASURE IMPACT 36 18CM CVD LR (INSTRUMENTS) ×5 IMPLANT
NEEDLE INSUFFLATION 120MM (ENDOMECHANICALS) ×5 IMPLANT
NS IRRIG 1000ML POUR BTL (IV SOLUTION) ×5 IMPLANT
PACK LAVH (CUSTOM PROCEDURE TRAY) ×5 IMPLANT
PACK ROBOTIC GOWN (GOWN DISPOSABLE) ×5 IMPLANT
PACK TRENDGUARD 450 HYBRID PRO (MISCELLANEOUS) IMPLANT
PROTECTOR NERVE ULNAR (MISCELLANEOUS) ×10 IMPLANT
RETRACTOR WND ALEXIS 25 LRG (MISCELLANEOUS) ×1 IMPLANT
RTRCTR WOUND ALEXIS 25CM LRG (MISCELLANEOUS) ×5
SEALER TISSUE G2 CVD JAW 45CM (ENDOMECHANICALS) ×5 IMPLANT
SET IRRIG TUBING LAPAROSCOPIC (IRRIGATION / IRRIGATOR) IMPLANT
SLEEVE XCEL OPT CAN 5 100 (ENDOMECHANICALS) ×5 IMPLANT
SPONGE LAP 18X18 RF (DISPOSABLE) ×9 IMPLANT
SUT MNCRL AB 4-0 PS2 18 (SUTURE) ×3 IMPLANT
SUT MON AB 2-0 CT1 36 (SUTURE) ×10 IMPLANT
SUT PDS AB 0 CT1 27 (SUTURE) ×6 IMPLANT
SUT PDS AB 0 CTX 60 (SUTURE) ×3 IMPLANT
SUT VIC AB 0 CT1 18XCR BRD8 (SUTURE) ×5 IMPLANT
SUT VIC AB 0 CT1 36 (SUTURE) ×5 IMPLANT
SUT VIC AB 0 CT1 8-18 (SUTURE) ×6
SUT VIC AB 2-0 CT1 27 (SUTURE) ×2
SUT VIC AB 2-0 CT1 TAPERPNT 27 (SUTURE) ×1 IMPLANT
SUT VIC AB 4-0 PS2 27 (SUTURE) ×5 IMPLANT
SUT VICRYL 0 TIES 12 18 (SUTURE) ×5 IMPLANT
TOWEL OR 17X24 6PK STRL BLUE (TOWEL DISPOSABLE) ×10 IMPLANT
TRAY FOLEY W/BAG SLVR 14FR (SET/KITS/TRAYS/PACK) ×5 IMPLANT
TRENDGUARD 450 HYBRID PRO PACK (MISCELLANEOUS)
TROCAR OPTI TIP 5M 100M (ENDOMECHANICALS) ×5 IMPLANT
TROCAR XCEL DIL TIP R 11M (ENDOMECHANICALS) ×5 IMPLANT
WARMER LAPAROSCOPE (MISCELLANEOUS) ×5 IMPLANT

## 2018-06-27 NOTE — Plan of Care (Signed)
  Problem: Education: Goal: Knowledge of the prescribed therapeutic regimen will improve Outcome: Progressing   Problem: Pain Managment: Goal: General experience of comfort will improve Outcome: Progressing   Problem: Safety: Goal: Ability to remain free from injury will improve Outcome: Progressing   

## 2018-06-27 NOTE — Anesthesia Postprocedure Evaluation (Signed)
Anesthesia Post Note  Patient: Andrea Hunt  Procedure(s) Performed: LAPAROSCOPY DIAGNOSTIC (N/A Abdomen) HYSTERECTOMY ABDOMINAL WITH BILATERAL SALPINGECTOMY (Bilateral Abdomen)     Patient location during evaluation: Mother Baby Anesthesia Type: General Level of consciousness: awake, awake and alert and oriented Pain management: pain level controlled Vital Signs Assessment: post-procedure vital signs reviewed and stable Respiratory status: spontaneous breathing, nonlabored ventilation and respiratory function stable Cardiovascular status: stable Postop Assessment: no headache, no backache, patient able to bend at knees, no apparent nausea or vomiting, adequate PO intake and able to ambulate Anesthetic complications: no    Last Vitals:  Vitals:   06/27/18 1215 06/27/18 1318  BP:  122/79  Pulse: 79 75  Resp: 16 (!) 8  Temp: 36.6 C 37.2 C  SpO2: 97% 97%    Last Pain:  Vitals:   06/27/18 1336  TempSrc:   PainSc: 4    Pain Goal: Patients Stated Pain Goal: 3 (06/27/18 1150)               Andrea Hunt

## 2018-06-27 NOTE — Transfer of Care (Signed)
Immediate Anesthesia Transfer of Care Note  Patient: Jacki ConesShelly L Redd  Procedure(s) Performed: LAPAROSCOPY DIAGNOSTIC (N/A Abdomen) HYSTERECTOMY ABDOMINAL WITH BILATERAL SALPINGECTOMY (Bilateral Abdomen)  Patient Location: PACU  Anesthesia Type:General  Level of Consciousness: awake, alert , oriented and patient cooperative  Airway & Oxygen Therapy: Patient Spontanous Breathing and Patient connected to nasal cannula oxygen  Post-op Assessment: Report given to RN, Post -op Vital signs reviewed and stable and Patient moving all extremities X 4  Post vital signs: Reviewed and stable  Last Vitals:  Vitals Value Taken Time  BP    Temp    Pulse 78 06/27/2018 10:03 AM  Resp 17 06/27/2018 10:03 AM  SpO2 99 % 06/27/2018 10:03 AM  Vitals shown include unvalidated device data.  Last Pain:  Vitals:   06/27/18 0629  TempSrc: Oral  PainSc: 0-No pain      Patients Stated Pain Goal: 4 (06/27/18 [id number])  Complications: No apparent anesthesia complications

## 2018-06-27 NOTE — Progress Notes (Signed)
The patient was re-examined with no change in status 

## 2018-06-27 NOTE — Anesthesia Procedure Notes (Signed)
Procedure Name: Intubation Date/Time: 06/27/2018 8:03 AM Performed by: Alvy Bimler, CRNA Pre-anesthesia Checklist: Patient identified, Emergency Drugs available, Suction available and Patient being monitored Patient Re-evaluated:Patient Re-evaluated prior to induction Oxygen Delivery Method: Circle system utilized Preoxygenation: Pre-oxygenation with 100% oxygen Induction Type: IV induction Ventilation: Mask ventilation without difficulty Laryngoscope Size: Mac and 3 Grade View: Grade II Tube type: Oral Tube size: 7.0 mm Number of attempts: 1 Airway Equipment and Method: Stylet Placement Confirmation: ETT inserted through vocal cords under direct vision,  positive ETCO2 and breath sounds checked- equal and bilateral Secured at: 21 (right lip) cm Tube secured with: Tape Dental Injury: Teeth and Oropharynx as per pre-operative assessment

## 2018-06-27 NOTE — Op Note (Signed)
Preoperative diagnosis: Adenomyosis, pelvic pain, dysmenorrhea, menorrhagia  Postoperative diagnosis: Same  Procedure: Laparoscopy followed by TAH, bilateral salpingectomy  Surgeon: Marcelle Overlie  Assistant: [place]  EBL: 250 cc  Procedure and findings:  Patient was taken to the operating room after an adequate level of general anesthesia was obtained with the patient's legs in stirrups the abdomen perineum and vagina were prepped and draped.  The bladder was drained with a Foley catheter EUA was carried out the uterus was 10 weeks size symmetrically enlarged, there was no significant descent with application of the tenaculum or the Hulka.  Appropriate timeouts were taken at that point.  Attention directed to the abdomen where a 2 cm subumbilical incision was made the varies needle was introduced that difficulty difficulty its intra-abdominal position was verified by pressure water testing.  After 2-1/2 L pneumoperitoneum seen created, lap scopic trocar and sleeve were then introduced without difficulty.  There was no evidence of any bleeding or trauma.  3 fingerbreadths over the symphysis in the midline a 5 mm trocar was inserted under direct visualization the patient was then placed in Trendelenburg and the pelvic findings as follows.  The uterus was symmetrically enlarged 10 weeks size there were some significant bladder flap adhesions in particular in the area of the right round ligament.  Visualization into the pelvis due to her habitus was difficult as we could not really antevert the uterus sufficiently.  Decision made to proceed with open TAH.  Foley catheter was already positioned in the legs were placed flatter, transverse incision made through her old C-section scar which was carried down the fascia incised transversely.  Rectus muscles divided in the midline, peritoneum entered superiorly without incident.  An Alexis retractor was position patient placed in Trendelenburg and bowel was packed  superiorly out of the field long Kelly clamps were then placed at each utero-ovarian pedicle starting on the left round ligament was clamped divided and suture-ligated with 0 Vicryl the peritoneum carried around to the midline.  Posterior leaf the broad ligament was perforated with the surgeon's finger and the utero-ovarian pedicle was isolated clamped divided first free tie followed by suture ligature 0 Vicryl.  Thus the left tube was conserved salpingectomy carried out with clamped cut tied technique removing the left tube.  The exact same repeated on the opposite side both tubes were removed, both ovaries conserved.  After the right round ligament was secured the bladder flap area was dissected free from scar tissue with sharp and blunt dissection until the bladder flap was developed below the cervical vaginal junction.  Uterine vasculature on either side was then skeletonized clamped divided and suture-ligated with 0 Vicryl.  In sequential manner the cardinal ligament uterosacral ligament and cervical vaginal pedicles were clamped divided and suture ligated with 0 Vicryl and the specimen excised vaginal cuff was closed with single interrupted 0 Vicryl sutures.  The pelvis was then irrigated noted to be hemostatic.  Arista was applied to the vaginal cuff.  Prior to closure sponge, needle, instrument counts reported as correct x2.  Peritoneum closed with a 3-0 Vicryl running suture of the same on the rectus muscles in the midline double loop 0 PDS used to close the fascia transversely.  Subtenons tissue was undermined to affect better closure this was made hemostatic with IV diluted Exparel solution with quarter percent Marcaine and saline was then injected subfascially and subcutaneously for postoperative analgesia.  Skin closed with a 4-0 Monocryl subcuticular closure pressure dressing applied she tolerated this well went  to recovery room in good condition.  Clear urine noted in the case  Dictated with  [employer]  Meriel Picaichard M Amareon Phung MD

## 2018-06-27 NOTE — Anesthesia Postprocedure Evaluation (Signed)
Anesthesia Post Note  Patient: Jacki ConesShelly L Watlington  Procedure(s) Performed: LAPAROSCOPY DIAGNOSTIC (N/A Abdomen) HYSTERECTOMY ABDOMINAL WITH BILATERAL SALPINGECTOMY (Bilateral Abdomen)     Patient location during evaluation: PACU Anesthesia Type: General Level of consciousness: awake and alert Pain management: pain level controlled Vital Signs Assessment: post-procedure vital signs reviewed and stable Respiratory status: spontaneous breathing, nonlabored ventilation, respiratory function stable and patient connected to nasal cannula oxygen Cardiovascular status: blood pressure returned to baseline and stable Postop Assessment: no apparent nausea or vomiting Anesthetic complications: no    Last Vitals:  Vitals:   06/27/18 1145 06/27/18 1150  BP:    Pulse:  78  Resp: 16 16  Temp:    SpO2: 99% 100%    Last Pain:  Vitals:   06/27/18 1150  TempSrc:   PainSc: 3    Pain Goal: Patients Stated Pain Goal: 3 (06/27/18 1150)               Trevor IhaStephen A Alyana Kreiter

## 2018-06-28 ENCOUNTER — Encounter (HOSPITAL_COMMUNITY): Payer: Self-pay | Admitting: Obstetrics and Gynecology

## 2018-06-28 LAB — CBC
HEMATOCRIT: 31.9 % — AB (ref 36.0–46.0)
Hemoglobin: 10.4 g/dL — ABNORMAL LOW (ref 12.0–15.0)
MCH: 27.1 pg (ref 26.0–34.0)
MCHC: 32.6 g/dL (ref 30.0–36.0)
MCV: 83.1 fL (ref 78.0–100.0)
PLATELETS: 273 10*3/uL (ref 150–400)
RBC: 3.84 MIL/uL — ABNORMAL LOW (ref 3.87–5.11)
RDW: 17.5 % — AB (ref 11.5–15.5)
WBC: 15 10*3/uL — AB (ref 4.0–10.5)

## 2018-06-28 MED ORDER — OXYCODONE-ACETAMINOPHEN 5-325 MG PO TABS
1.0000 | ORAL_TABLET | Freq: Four times a day (QID) | ORAL | Status: DC | PRN
Start: 2018-06-28 — End: 2018-06-29
  Administered 2018-06-28: 2 via ORAL
  Administered 2018-06-28: 1 via ORAL
  Administered 2018-06-28: 2 via ORAL
  Administered 2018-06-29: 1 via ORAL
  Filled 2018-06-28: qty 2
  Filled 2018-06-28 (×2): qty 1
  Filled 2018-06-28: qty 2

## 2018-06-28 NOTE — Progress Notes (Signed)
Pt has showered. Pressure drsg came off in the shower. Honeycomb underneath remains intact, clean, and dry. Carmelina DaneERRI L Yaneli Keithley, RN

## 2018-06-28 NOTE — Progress Notes (Signed)
1 Day Post-Op Procedure(s) (LRB): LAPAROSCOPY DIAGNOSTIC (N/A) HYSTERECTOMY ABDOMINAL WITH BILATERAL SALPINGECTOMY (Bilateral)  Subjective: Patient reports tolerating PO.    Objective: I have reviewed patient's vital signs and labs. CBC    Component Value Date/Time   WBC 15.0 (H) 06/28/2018 0532   RBC 3.84 (L) 06/28/2018 0532   HGB 10.4 (L) 06/28/2018 0532   HCT 31.9 (L) 06/28/2018 0532   PLT 273 06/28/2018 0532   MCV 83.1 06/28/2018 0532   MCH 27.1 06/28/2018 0532   MCHC 32.6 06/28/2018 0532   RDW 17.5 (H) 06/28/2018 0532   LYMPHSABS 3.1 11/23/2012 0100   MONOABS 0.7 11/23/2012 0100   EOSABS 0.3 11/23/2012 0100   BASOSABS 0.0 11/23/2012 0100    abd soft + BS, dressing dry  Assessment: s/p Procedure(s): LAPAROSCOPY DIAGNOSTIC (N/A) HYSTERECTOMY ABDOMINAL WITH BILATERAL SALPINGECTOMY (Bilateral): stable  Plan: Advance diet Encourage ambulation Advance to PO medication  LOS: 1 day    Meriel PicaRichard M Azaela Caracci 06/28/2018, 8:11 AM

## 2018-06-29 MED ORDER — IBUPROFEN 800 MG PO TABS: 800.0000 mg | ORAL_TABLET | Freq: Three times a day (TID) | ORAL | 0 refills | Status: DC | PRN

## 2018-06-29 MED ORDER — OXYCODONE-ACETAMINOPHEN 5-325 MG PO TABS: 1.0000 | ORAL_TABLET | Freq: Four times a day (QID) | ORAL | 0 refills | Status: DC | PRN

## 2018-06-29 NOTE — Discharge Summary (Signed)
Physician Discharge Summary  Patient ID: Andrea Hunt MRN: [id number] DOB/AGE: 43-May-1977 43 y.o.  Admit date: 06/27/2018 Discharge date: 06/29/2018  Admission Diagnoses:ADENOMYOSIS, DYSMENORRHEA, MENORRHAGIA  Discharge Diagnoses: SAME Active Problems:   Menorrhagia   Discharged Condition: good  Hospital Course: adm for poss LAVH, convertted to TAH + BS, on POD 1, diet advance, by POD 2, afeb, tol PO ambulating and ready for D/C  Consults: None  Significant Diagnostic Studies: labs:  CBC    Component Value Date/Time   WBC 15.0 (H) 06/28/2018 0532   RBC 3.84 (L) 06/28/2018 0532   HGB 10.4 (L) 06/28/2018 0532   HCT 31.9 (L) 06/28/2018 0532   PLT 273 06/28/2018 0532   MCV 83.1 06/28/2018 0532   MCH 27.1 06/28/2018 0532   MCHC 32.6 06/28/2018 0532   RDW 17.5 (H) 06/28/2018 0532   LYMPHSABS 3.1 11/23/2012 0100   MONOABS 0.7 11/23/2012 0100   EOSABS 0.3 11/23/2012 0100   BASOSABS 0.0 11/23/2012 0100     Treatments: surgery: DL>>TAH, BS  Discharge Exam: Blood pressure 117/64, pulse 71, temperature 98.5 F (36.9 C), resp. rate 16, height 5\' 4"  (1.626 m), weight 232 lb 2 oz (105.3 kg), last menstrual period 06/05/2018, SpO2 100 %. abd soft + BS, bandage dry  Disposition: Discharge disposition: 01-Home or Self Care        Allergies as of 06/29/2018      Reactions   Augmentin [amoxicillin-pot Clavulanate] Hives   Has patient had a PCN reaction causing immediate rash, facial/tongue/throat swelling, SOB or lightheadedness with hypotension: Yes Has patient had a PCN reaction causing severe rash involving mucus membranes or skin necrosis: No Has patient had a PCN reaction that required hospitalization: No Has patient had a PCN reaction occurring within the last 10 years: Yes If all of the above answers are "NO", then may proceed with Cephalosporin use.   Penicillins Hives   Has patient had a PCN reaction causing immediate rash, facial/tongue/throat swelling, SOB or  lightheadedness with hypotension: Yes Has patient had a PCN reaction causing severe rash involving mucus membranes or skin necrosis: No Has patient had a PCN reaction that required hospitalization: No Has patient had a PCN reaction occurring within the last 10 years: Yes If all of the above answers are "NO", then may proceed with Cephalosporin use.   Zyrtec [cetirizine] Anxiety      Medication List    STOP taking these medications   magnesium gluconate 500 MG tablet Commonly known as:  MAGONATE     TAKE these medications   citalopram 20 MG tablet Commonly known as:  CELEXA Take 20 mg by mouth at bedtime.   fluticasone 50 MCG/ACT nasal spray Commonly known as:  FLONASE Place 1 spray into both nostrils daily.   ibuprofen 800 MG tablet Commonly known as:  ADVIL,MOTRIN Take 1 tablet (800 mg total) by mouth every 8 (eight) hours as needed (mild pain). What changed:    reasons to take this  Another medication with the same name was removed. Continue taking this medication, and follow the directions you see here.   montelukast 10 MG tablet Commonly known as:  SINGULAIR Take 10 mg by mouth at bedtime.   oxyCODONE-acetaminophen 5-325 MG tablet Commonly known as:  PERCOCET/ROXICET Take 1-2 tablets by mouth every 6 (six) hours as needed for severe pain.      Follow-up Information    Richarda Overlie, MD. Schedule an appointment as soon as possible for a visit in 10 day(s).   Specialty:  Obstetrics and Gynecology Contact information: [address] [place] [address] [phone]           Signed: Meriel PicaRichard M Kyliegh Jester 06/29/2018, 8:00 AM

## 2019-10-06 ENCOUNTER — Other Ambulatory Visit: Payer: Self-pay | Admitting: Obstetrics and Gynecology

## 2019-10-06 DIAGNOSIS — R928 Other abnormal and inconclusive findings on diagnostic imaging of breast: Secondary | ICD-10-CM

## 2019-10-14 ENCOUNTER — Other Ambulatory Visit: Payer: Self-pay

## 2019-10-14 ENCOUNTER — Ambulatory Visit: Payer: BC Managed Care – PPO

## 2019-10-14 ENCOUNTER — Ambulatory Visit
Admission: RE | Admit: 2019-10-14 | Discharge: 2019-10-14 | Disposition: A | Discharge status: Home / Self Care | Payer: BC Managed Care – PPO | Source: Ambulatory Visit | Attending: Obstetrics and Gynecology | Admitting: Obstetrics and Gynecology

## 2019-10-14 DIAGNOSIS — R928 Other abnormal and inconclusive findings on diagnostic imaging of breast: Secondary | ICD-10-CM

## 2020-07-01 IMAGING — MG MM DIGITAL DIAGNOSTIC UNILAT*L* W/ TOMO W/ CAD
4 series · 4 of 12 positions shown · non-contrast
Comparison: Previous exam(s).

CLINICAL DATA: Screening recall for a left breast asymmetry.

EXAM:
DIGITAL DIAGNOSTIC UNILATERAL LEFT MAMMOGRAM WITH CAD AND TOMO

[L MLO synth-2D]
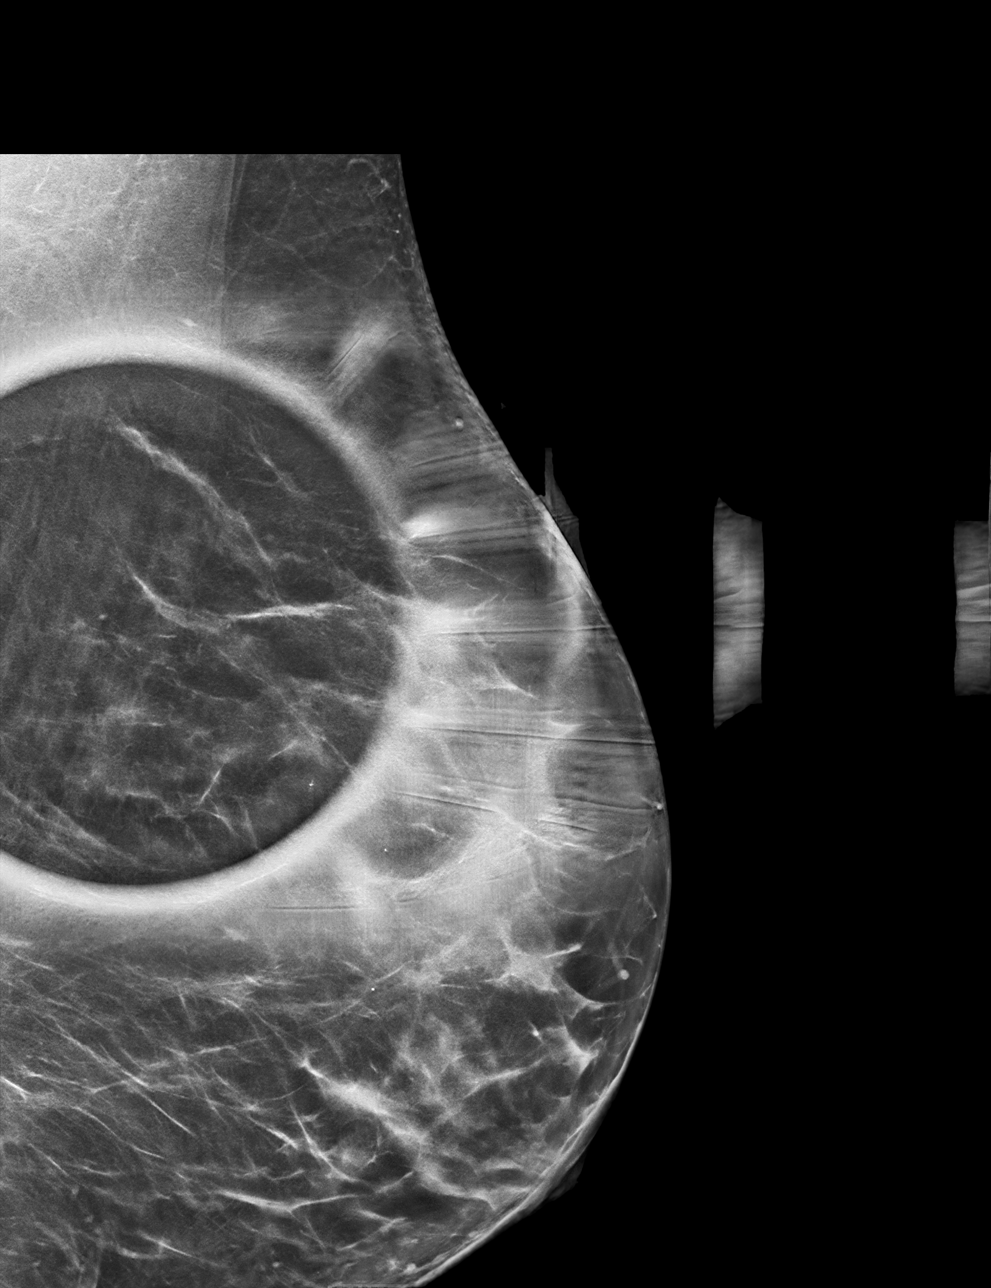

[L ML synth-2D]
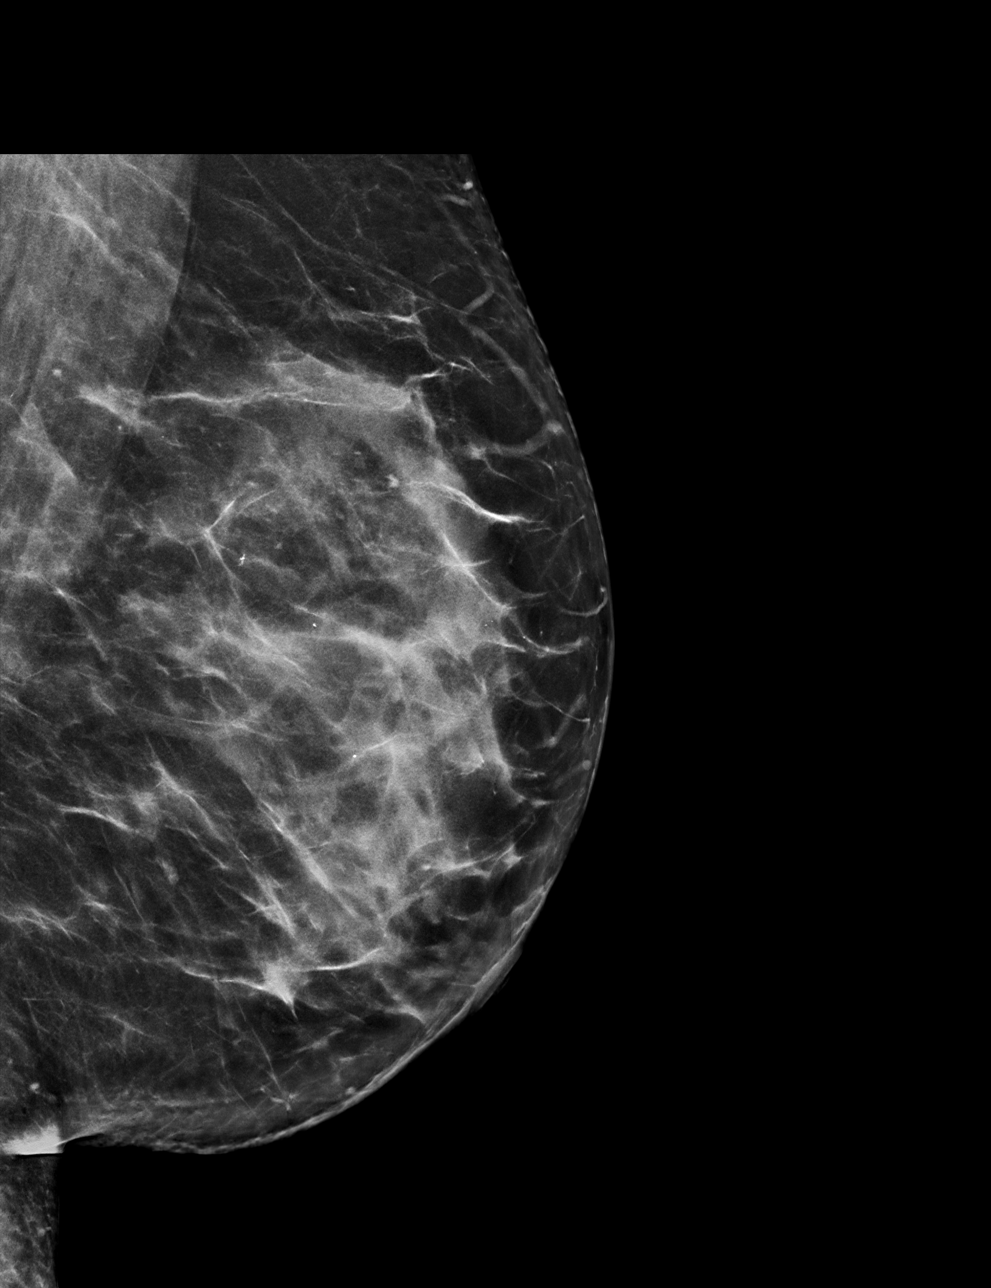

[L ML tomo · tomo slice 39/77.0]
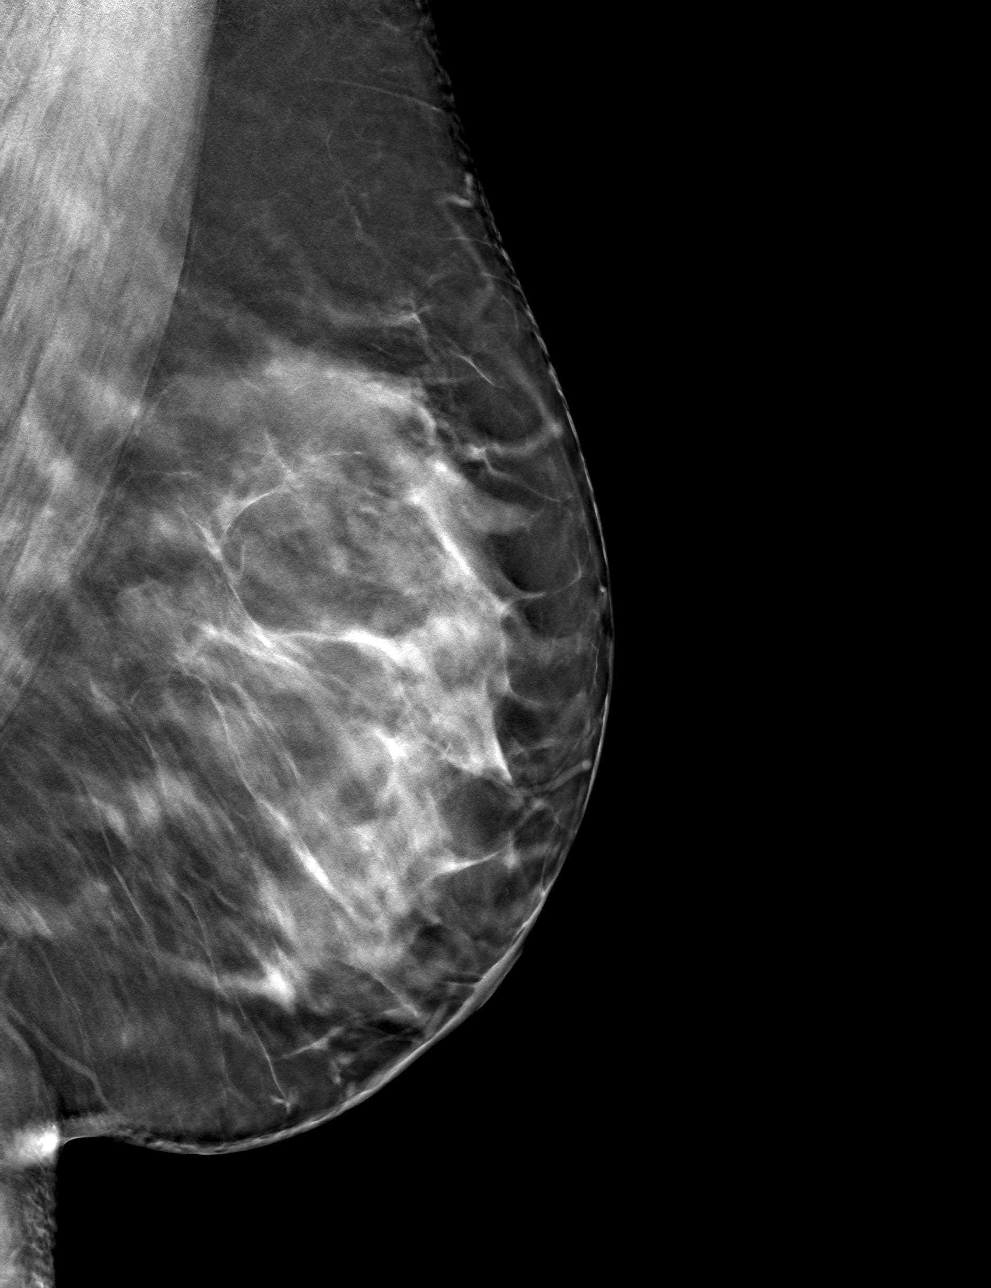

[L MLO tomo · tomo slice 40/79.0]
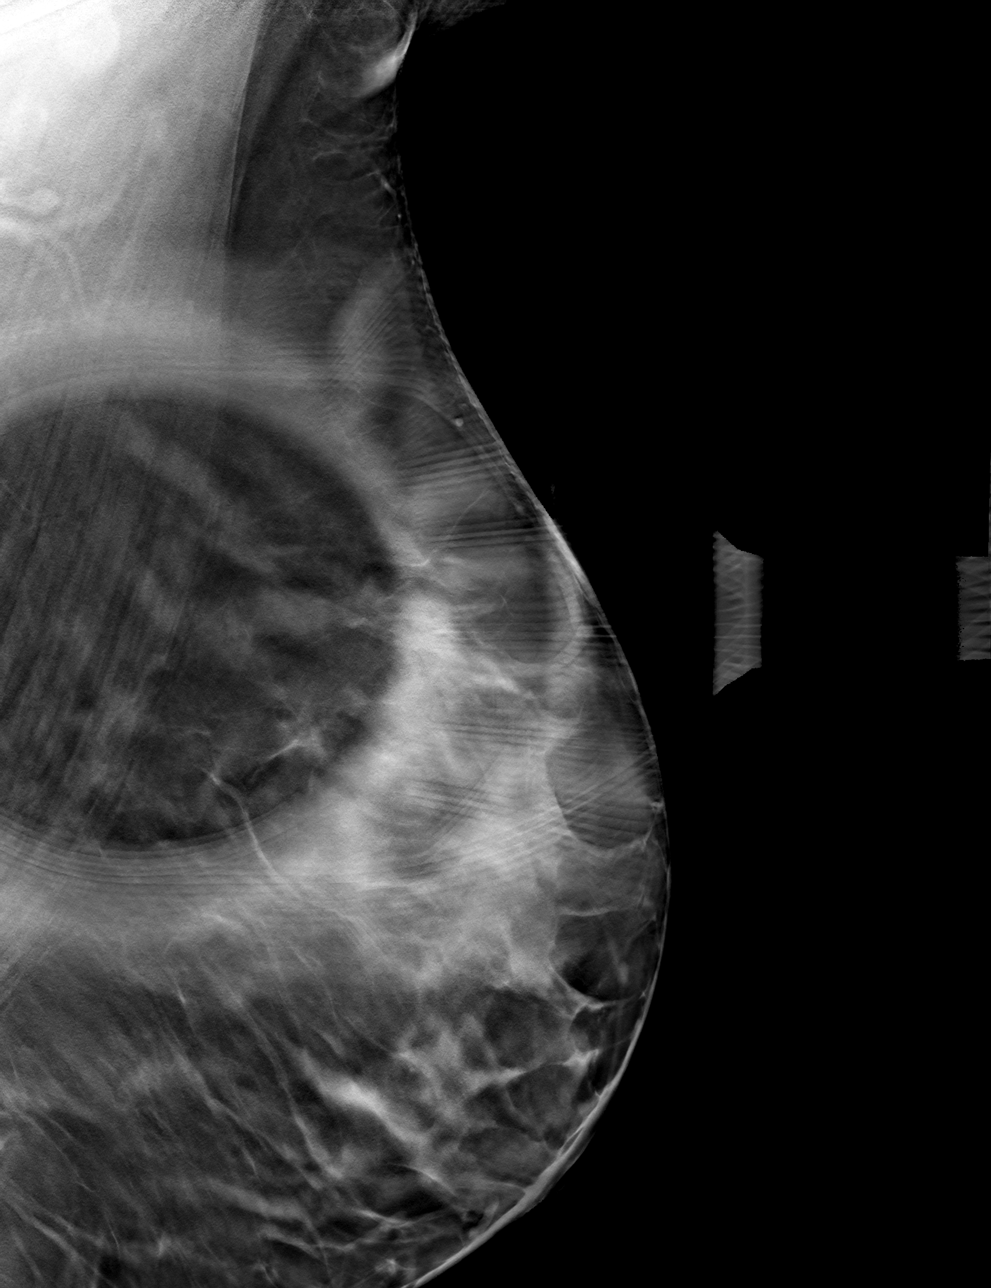

[4 of 12 positions shown; findings below may reference images not displayed]

ACR Breast Density Category c: The breast tissue is heterogeneously
dense, which may obscure small masses.
FINDINGS: The asymmetry in the superior posterior left breast resolves with
spot compression tomosynthesis imaging. No suspicious
calcifications, masses or areas of distortion are seen in the left
breast.

Mammographic images were processed with CAD.
IMPRESSION: Resolution of the left breast asymmetry consistent with overlapping
fibroglandular tissue.

RECOMMENDATION:
Screening mammogram in one year.(Code:KA-[id number])

I have discussed the findings and recommendations with the patient.
If applicable, a reminder letter will be sent to the patient
regarding the next appointment.

BI-RADS CATEGORY  1: Negative.

## 2023-04-02 ENCOUNTER — Other Ambulatory Visit: Payer: Self-pay | Admitting: Obstetrics and Gynecology

## 2023-04-02 DIAGNOSIS — R928 Other abnormal and inconclusive findings on diagnostic imaging of breast: Secondary | ICD-10-CM

## 2023-05-03 ENCOUNTER — Other Ambulatory Visit: Payer: BC Managed Care – PPO

## 2024-05-13 ENCOUNTER — Encounter: Payer: Self-pay | Admitting: Internal Medicine

## 2024-05-13 ENCOUNTER — Other Ambulatory Visit: Payer: Self-pay

## 2024-05-13 ENCOUNTER — Ambulatory Visit (INDEPENDENT_AMBULATORY_CARE_PROVIDER_SITE_OTHER): Payer: Self-pay | Admitting: Internal Medicine

## 2024-05-13 VITALS — BP 134/72 | HR 69 | Temp 99.3°F | Resp 20 | Ht 64.5 in | Wt 233.0 lb

## 2024-05-13 DIAGNOSIS — J3089 Other allergic rhinitis: Secondary | ICD-10-CM | POA: Diagnosis not present

## 2024-05-13 DIAGNOSIS — H1045 Other chronic allergic conjunctivitis: Secondary | ICD-10-CM | POA: Diagnosis not present

## 2024-05-13 DIAGNOSIS — Z889 Allergy status to unspecified drugs, medicaments and biological substances status: Secondary | ICD-10-CM

## 2024-05-13 DIAGNOSIS — Z720 Tobacco use: Secondary | ICD-10-CM | POA: Diagnosis not present

## 2024-05-13 DIAGNOSIS — L501 Idiopathic urticaria: Secondary | ICD-10-CM

## 2024-05-13 NOTE — Progress Notes (Signed)
 NEW PATIENT Date of Service/Encounter:  05/13/24 Referring provider: Lory Rough., PA-C Primary care provider: Lory Rough., PA-C  Subjective:  Andrea Hunt is a 48 y.o. female  presenting today for evaluation of urticaria  History obtained from: chart review and patient.   Discussed the use of AI scribe software for clinical note transcription with the patient, who gave verbal consent to proceed.  History of Present Illness Andrea Hunt is a 48 year old female who presents with chronic hives for evaluation.  She has been experiencing hives for approximately a year and a half, occurring at least once a month. The hives typically appear in small clusters on her ankles, hands, bottoms of her feet, back of her head, and under her breasts. They itch for about 24 hours and then persist without itching for another day before resolving.  The onset of her hives was noted in November while at an airport, with the first occurrence on her ankle. She has not identified any consistent triggers related to food, drugs, or environmental factors, despite attempts to correlate the hives with various exposures such as dogs, deodorant, and fruit punch. She has experienced hives even when away from her dogs, such as during a trip to [place].  She has a history of seasonal allergies (rhinnorhea, sneezing, ocular itching) , for which she takes montelukast  daily. She added Xyzal to her regimen about a year ago, which she believes has improved her hives. She takes Xyzal in the morning and montelukast  at night. For severe outbreaks, she uses Benadryl  and Pepcid, although these only help with itching and not with the resolution of the hives.  There is no association of her hives with sweating, hot showers, or red meat consumption. She does not drink alcohol and has not noticed any correlation with her menstrual cycle, as she had a hysterectomy but retains her ovaries. She experienced a significant  stress period when the hives began, coinciding with a new job, but has since reduced her stress levels without a corresponding decrease in hives.  She has not had any recent illnesses or surgeries prior to the onset of her hives, and she did not contract COVID-19 until much later. She recalls a severe outbreak of hives during a bout of the flu in March, which did not respond to a steroid injection. She has a history of drug allergies, including reactions to penicillin and its derivatives, which have caused hives and, in one instance, tongue swelling.    Other allergy screening: Asthma: no Rhino conjunctivitis: yes Food allergy: no Medication allergy: yes Hymenoptera allergy: no Urticaria: yes Eczema:no History of recurrent infections suggestive of immunodeficency: no Vaccinations are up to date.   Past Medical History: Past Medical History:  Diagnosis Date   Anemia    Anxiety    Depression    PONV (postoperative nausea and vomiting)    with C/S surgery only, no problems with general anesthesia   Seasonal allergies    Medication List:  Current Outpatient Medications  Medication Sig Dispense Refill   fluticasone  (FLONASE ) 50 MCG/ACT nasal spray Place 1 spray into both nostrils daily.     levocetirizine (XYZAL ALLERGY 24HR) 5 MG tablet Take by mouth daily.     montelukast  (SINGULAIR ) 10 MG tablet Take 10 mg by mouth at bedtime.     No current facility-administered medications for this visit.   Known Allergies:  Allergies  Allergen Reactions   Augmentin [Amoxicillin-Pot Clavulanate] Hives    Has patient had  a PCN reaction causing immediate rash, facial/tongue/throat swelling, SOB or lightheadedness with hypotension: Yes Has patient had a PCN reaction causing severe rash involving mucus membranes or skin necrosis: No Has patient had a PCN reaction that required hospitalization: No Has patient had a PCN reaction occurring within the last 10 years: Yes If all of the above answers  are "NO", then may proceed with Cephalosporin use.   Cefdinir Swelling    Tongue swelling and hives   Other Hives   Penicillins Hives    Has patient had a PCN reaction causing immediate rash, facial/tongue/throat swelling, SOB or lightheadedness with hypotension: Yes Has patient had a PCN reaction causing severe rash involving mucus membranes or skin necrosis: No Has patient had a PCN reaction that required hospitalization: No Has patient had a PCN reaction occurring within the last 10 years: Yes If all of the above answers are "NO", then may proceed with Cephalosporin use.   Gabapentin  Dermatitis   Ropinirole Dermatitis   Zyrtec [Cetirizine] Anxiety   Past Surgical History: Past Surgical History:  Procedure Laterality Date   ABLATION  10/2017   CESAREAN SECTION     x 2   DILATION AND CURETTAGE OF UTERUS     w/ablation    HYSTERECTOMY ABDOMINAL WITH SALPINGECTOMY Bilateral 06/27/2018   Procedure: HYSTERECTOMY ABDOMINAL WITH BILATERAL SALPINGECTOMY;  Surgeon: Woodrow Hazy, MD;  Location: [facility];  Service: Gynecology;  Laterality: Bilateral;   LAPAROSCOPY N/A 06/27/2018   Procedure: LAPAROSCOPY DIAGNOSTIC;  Surgeon: Woodrow Hazy, MD;  Location: [facility];  Service: Gynecology;  Laterality: N/A;   WISDOM TOOTH EXTRACTION  1995   Family History: History reviewed. No pertinent family history. Social History: Andrea Hunt lives [facility] that is 48 years old, no water damage, wood throughout, gas and heat pump for heating and central cooling, 2 dogs with access to bedroom DM precautions dust mite precautions, smokes 1/2ppd , works as a Sr Mgr lease admin, .   ROS:  All other systems negative except as noted per HPI.  Objective:  Blood pressure 134/72, pulse 69, temperature 99.3 F (37.4 C), temperature source Oral, resp. rate 20, height 5' 4.5" (1.638 m), weight 233 lb (105.7 kg), last menstrual period 06/05/2018, SpO2 98%. Body mass index is 39.38 kg/m. Physical Exam:  General Appearance:   Alert, cooperative, no distress, appears stated age  Head:  Normocephalic, without obvious abnormality, atraumatic  Eyes:  Conjunctiva clear, EOM's intact  Ears EACs normal bilaterally  Nose: Nares normal, normal mucosa, no visible anterior polyps, and septum midline  Throat: Lips, tongue normal; teeth and gums normal, normal posterior oropharynx  Neck: Supple, symmetrical  Lungs:   clear to auscultation bilaterally, Respirations unlabored, no coughing  Heart:  regular rate and rhythm and no murmur, Appears well perfused  Extremities: No edema  Skin: Skin color, texture, turgor normal and no rashes or lesions on visualized portions of skin  Neurologic: No gross deficits   Diagnostics: None done    Labs:  Lab Orders         Chronic Urticaria (CU) Eval         Tryptase         IgE       Assessment and Plan  Idiopathic urticaria - Plan: Chronic Urticaria (CU) Eval, Tryptase, IgE  Other allergic rhinitis  Other chronic allergic conjunctivitis of both eyes  Tobacco user  Multiple drug allergies  Assessment & Plan   Patient Instructions  Chronic Idiopathic Urticaria: - this is defined as hives lasting  more than 6 weeks without an identifiable trigger - hives can be from a number of different sources including infections, allergies, vibration, temperature, pressure among many others other possible causes - often an identifiable cause is not determined - some potential triggers include: stress, illness, NSAIDs, aspirin, hormonal changes - you do not have any red flag symptoms to make us  concerned about secondary causes of hives, but we will screen for these for reassurance with: chronic urticaria panel, tryptase, IgE,  - approximately 50% of patients with chronic hives can have some associated swelling of the face/lips/eyelids (this is not a cause for alarm and does not typically progress onto systemic allergic reactions)  Therapy Plan:  - Increase xyzal to 5mg  twice daily   - Restart pepcid 20mg  daily  - Continue montelukast  10mg  at night  - Return for allergy testing (1-68)  - if hives are still uncontrolled with the above regimen, please arrange an appointment for discussion of Xolair (omalizumab)- an injectable medication for hives  Seasonal allergic rhinitis Seasonal allergic rhinitis with sneezing, pruritus, and nasal congestion, exacerbated in fall. Managed with montelukast  and Xyzal, which also aid in urticaria control. Discussed overlap with chronic spontaneous urticaria and role of environmental allergens in symptom exacerbation. - Continue montelukast  and Xyzal for symptom management. - Return for allergy testing (1-68)    Follow up: for allergy testing (1-68) on Thursday June 19th at 8:30.  Hold all antihistamines (xyzal, pepcid, benadryl ) for 3 days prior               This note in its entirety was forwarded to the Provider who requested this consultation.  Other:    Thank you for your kind referral. I appreciate the opportunity to take part in [facility] care. Please do not hesitate to contact me with questions.  Sincerely,  Thank you so much for letting me partake in your care today.  Don't hesitate to reach out if you have any additional concerns!  Orelia Binet, MD  [facility] [place] [place]

## 2024-05-13 NOTE — Patient Instructions (Addendum)
 Chronic Idiopathic Urticaria: - this is defined as hives lasting more than 6 weeks without an identifiable trigger - hives can be from a number of different sources including infections, allergies, vibration, temperature, pressure among many others other possible causes - often an identifiable cause is not determined - some potential triggers include: stress, illness, NSAIDs, aspirin, hormonal changes - you do not have any red flag symptoms to make us  concerned about secondary causes of hives, but we will screen for these for reassurance with: chronic urticaria panel, tryptase, IgE,  - approximately 50% of patients with chronic hives can have some associated swelling of the face/lips/eyelids (this is not a cause for alarm and does not typically progress onto systemic allergic reactions)  Therapy Plan:  - Increase xyzal to 5mg  twice daily  - Restart pepcid 20mg  daily  - Continue montelukast  10mg  at night  - Return for allergy testing (1-68)  - if hives are still uncontrolled with the above regimen, please arrange an appointment for discussion of Xolair (omalizumab)- an injectable medication for hives  Seasonal allergic rhinitis Seasonal allergic rhinitis with sneezing, pruritus, and nasal congestion, exacerbated in fall. Managed with montelukast  and Xyzal, which also aid in urticaria control. Discussed overlap with chronic spontaneous urticaria and role of environmental allergens in symptom exacerbation. - Continue montelukast  and Xyzal for symptom management. - Return for allergy testing (1-68)    Follow up: for allergy testing (1-68) on Thursday June 19th at 8:30.  Hold all antihistamines (xyzal, pepcid, benadryl ) for 3 days prior

## 2024-05-22 ENCOUNTER — Ambulatory Visit (INDEPENDENT_AMBULATORY_CARE_PROVIDER_SITE_OTHER): Admitting: Internal Medicine

## 2024-05-22 DIAGNOSIS — J3089 Other allergic rhinitis: Secondary | ICD-10-CM

## 2024-05-22 DIAGNOSIS — J302 Other seasonal allergic rhinitis: Secondary | ICD-10-CM | POA: Diagnosis not present

## 2024-05-22 DIAGNOSIS — L501 Idiopathic urticaria: Secondary | ICD-10-CM

## 2024-05-22 NOTE — Patient Instructions (Addendum)
 Allergy Test (05/22/24); grass, mold   Chronic Idiopathic Urticaria: Labs (05/22/24): tryptase 11.9, ESR 44 - Continue  xyzal to 5mg  twice daily  - Continue pepcid 20mg  daily  - Continue montelukast  10mg  at night  - if hives are still uncontrolled with the above regimen, please arrange an appointment for discussion of Xolair (omalizumab)- an injectable medication for hives  Seasonal allergic rhinitis Seasonal allergic rhinitis with sneezing, pruritus, and nasal congestion, exacerbated in fall. Managed with montelukast  and Xyzal, which also aid in urticaria control. Discussed overlap with chronic spontaneous urticaria and role of environmental allergens in symptom exacerbation. - Continue montelukast  and Xyzal for symptom management.   Follow up: in 4-6 weeks   Reducing Pollen Exposure  The American Academy of Allergy, Asthma and Immunology suggests the following steps to reduce your exposure to pollen during allergy seasons.    Do not hang sheets or clothing out to dry; pollen may collect on these items. Do not mow lawns or spend time around freshly cut grass; mowing stirs up pollen. Keep windows closed at night.  Keep car windows closed while driving. Minimize morning activities outdoors, a time when pollen counts are usually at their highest. Stay indoors as much as possible when pollen counts or humidity is high and on windy days when pollen tends to remain in the air longer. Use air conditioning when possible.  Many air conditioners have filters that trap the pollen spores. Use a HEPA room air filter to remove pollen form the indoor air you breathe.  Control of Mold Allergen   Mold and fungi can grow on a variety of surfaces provided certain temperature and moisture conditions exist.  Outdoor molds grow on plants, decaying vegetation and soil.  The major outdoor mold, Alternaria and Cladosporium, are found in very high numbers during hot and dry conditions.  Generally, a late Summer -  Fall peak is seen for common outdoor fungal spores.  Rain will temporarily lower outdoor mold spore count, but counts rise rapidly when the rainy period ends.  The most important indoor molds are Aspergillus and Penicillium.  Dark, humid and poorly ventilated basements are ideal sites for mold growth.  The next most common sites of mold growth are the bathroom and the kitchen.  Outdoor (Seasonal) Mold Control  Use air conditioning and keep windows closed Avoid exposure to decaying vegetation. Avoid leaf raking. Avoid grain handling. Consider wearing a face mask if working in moldy areas.    Indoor (Perennial) Mold Control   Maintain humidity below 50%. Clean washable surfaces with 5% bleach solution. Remove sources e.g. contaminated carpets.

## 2024-05-22 NOTE — Progress Notes (Signed)
 Date of Service/Encounter:  05/22/24  Allergy testing appointment   Initial visit on 05/13/24, seen for urticaria.  Please see that note for additional details.  Today reports for allergy diagnostic testing:    DIAGNOSTICS:  Skin Testing: Environmental allergy panel. Adequate positive and negative controls Results discussed with patient/family.  Airborne Adult Perc - 05/22/24 0843     Time Antigen Placed 0830    Allergen Manufacturer Floyd Hutchinson    Location Back    Number of Test 65    1. Control-Buffer 50% Glycerol Negative    2. Control-Histamine 2+    3. [place] 3+    4. [place] 2+    5. Johnson Negative    6. [place]  Blue Negative    7. Meadow Fescue Negative    8. Perennial Rye Negative    9. Timothy Negative    10. Ragweed Mix Negative    11. Cocklebur Negative    12. Plantain,  English Negative    13. Baccharis Negative    14. Dog Fennel Negative    15. Russian Thistle Negative    16. Lamb's Quarters Negative    17. Sheep Sorrell Negative    18. Rough Pigweed Negative    19. Marsh Elder, Rough Negative    20. Mugwort, Common Negative    21. Box, Elder Negative    22. Cedar, red Negative    23. Sweet Gum Negative    24. Pecan Pollen Negative    25. Pine Mix Negative    26. Walnut, Black Pollen Negative    27. Red Mulberry Negative    28. Ash Mix Negative    29. Birch Mix Negative    30. Beech American Negative    31. Cottonwood, [place] Negative    32. Hickory, White Negative    33. Maple Mix Negative    34. Oak, [place] Mix Negative    35. Sycamore Eastern Negative    36. Alternaria Alternata Negative    37. Cladosporium Herbarum Negative    38. Aspergillus Mix Negative    39. Penicillium Mix Negative    40. Bipolaris Sorokiniana (Helminthosporium) Negative    41. Drechslera Spicifera (Curvularia) Negative    42. Mucor Plumbeus Negative    43. Fusarium Moniliforme Negative    44. Aureobasidium Pullulans (pullulara) Negative    45. Rhizopus Oryzae  Negative    46. Botrytis Cinera Negative    47. Epicoccum Nigrum Negative    48. Phoma Betae Negative    49. Dust Mite Mix Negative    50. Cat Hair 10,000 BAU/ml Negative    51.  Dog Epithelia Negative    52. Mixed Feathers Negative    53. Horse Epithelia Negative    54. Cockroach, German Negative    55. Tobacco Leaf Negative          13 Food Perc - 05/22/24 0843       Test Information   Time Antigen Placed 0830    Allergen Manufacturer Floyd Hutchinson    Location Back    Number of allergen test 13          Intradermal - 05/22/24 1000     Time Antigen Placed 0900    Location Arm    Number of Test 13    Control 2+    Johnson Negative    7 Grass Negative    Ragweed Mix Negative    Weed Mix Negative    Tree Mix Negative    Mold 1 Negative  Mold 2 2+    Mold 3 2+    Mold 4 3+    Mite Mix Negative    Cat Negative    Dog Negative    Cockroach Negative          Allergy testing results were read and interpreted by myself, documented by clinical staff.  Patient provided with copy of allergy testing along with avoidance measures when indicated.   Orelia Binet, MD  [facility] 

## 2024-05-23 LAB — CHRONIC URTICARIA (CU) EVAL

## 2024-05-23 LAB — IGE: IgE (Immunoglobulin E), Serum: 49 [IU]/mL (ref 6–495)

## 2024-05-23 NOTE — Addendum Note (Signed)
 Addended by: Enos Harts on: 05/23/2024 08:32 AM   Modules accepted: Orders

## 2024-05-26 ENCOUNTER — Telehealth: Payer: Self-pay

## 2024-05-26 NOTE — Telephone Encounter (Signed)
 [place] from [facility] preliminary results  Immunoglobulin E, total 49 international {:8051996::unit,units} /ML reference interval 6-495.

## 2024-05-27 NOTE — Telephone Encounter (Signed)
 Message sent to Dr Lorin

## 2024-05-31 LAB — CHRONIC URTICARIA (CU) EVAL
ALT: 11 IU/L (ref 0–32)
Basophils Absolute: 0 10*3/uL (ref 0.0–0.2)
Basos: 0 %
CRP: 14 mg/L — ABNORMAL HIGH (ref 0–10)
EOS (ABSOLUTE): 0.3 10*3/uL (ref 0.0–0.4)
Eos: 2 %
Hematocrit: 44.9 % (ref 34.0–46.6)
Hemoglobin: 14.5 g/dL (ref 11.1–15.9)
Immature Grans (Abs): 0 10*3/uL (ref 0.0–0.1)
Immature Granulocytes: 0 %
Lymphocytes Absolute: 3.6 10*3/uL — ABNORMAL HIGH (ref 0.7–3.1)
Lymphs: 32 %
MCH: 30.2 pg (ref 26.6–33.0)
MCHC: 32.3 g/dL (ref 31.5–35.7)
MCV: 94 fL (ref 79–97)
Monocytes Absolute: 0.6 10*3/uL (ref 0.1–0.9)
Monocytes: 5 %
Neutrophils Absolute: 6.7 10*3/uL (ref 1.4–7.0)
Neutrophils: 61 %
Platelets: 304 10*3/uL (ref 150–450)
RBC: 4.8 x10E6/uL (ref 3.77–5.28)
RDW: 13.6 % (ref 11.7–15.4)
Sed Rate: 44 mm/h — ABNORMAL HIGH (ref 0–32)
TSH: 2.89 u[IU]/mL (ref 0.450–4.500)
Thyroperoxidase Ab SerPl-aCnc: 10 [IU]/mL (ref 0–34)
WBC: 11.1 10*3/uL — ABNORMAL HIGH (ref 3.4–10.8)

## 2024-05-31 LAB — TRYPTASE: Tryptase: 11.9 ug/L (ref 2.2–13.2)

## 2024-06-23 ENCOUNTER — Encounter: Payer: Self-pay | Admitting: Internal Medicine

## 2024-06-23 ENCOUNTER — Ambulatory Visit (INDEPENDENT_AMBULATORY_CARE_PROVIDER_SITE_OTHER): Admitting: Internal Medicine

## 2024-06-23 VITALS — BP 110/68 | HR 81 | Temp 96.4°F | Resp 20 | Wt 232.7 lb

## 2024-06-23 DIAGNOSIS — Z889 Allergy status to unspecified drugs, medicaments and biological substances status: Secondary | ICD-10-CM

## 2024-06-23 DIAGNOSIS — L501 Idiopathic urticaria: Secondary | ICD-10-CM | POA: Diagnosis not present

## 2024-06-23 DIAGNOSIS — H1045 Other chronic allergic conjunctivitis: Secondary | ICD-10-CM

## 2024-06-23 DIAGNOSIS — J302 Other seasonal allergic rhinitis: Secondary | ICD-10-CM

## 2024-06-23 DIAGNOSIS — J3089 Other allergic rhinitis: Secondary | ICD-10-CM

## 2024-06-23 NOTE — Patient Instructions (Addendum)
 Allergy  Test (05/22/24); grass, mold   Chronic Idiopathic Urticaria: Labs (05/22/24): tryptase 11.9, ESR 44 - Continue  xyzal to 5mg  twice daily  - Continue pepcid 20mg  daily  - Continue montelukast  10mg  at night  - if hives are still uncontrolled with the above regimen, please arrange an appointment for discussion of Xolair (omalizumab)- an injectable medication for hives  Seasonal allergic rhinitis Seasonal allergic rhinitis with sneezing, pruritus, and nasal congestion, exacerbated in fall.  - Continue montelukast  and Xyzal for symptom management.   Multiple Drug Allergies  - Okay to retry restless leg medications as it is unlikely these were true drug allergies but exacerbations of your underlying chronic hives   Follow up: in 3 months   Reducing Pollen Exposure  The American Academy of Allergy , Asthma and Immunology suggests the following steps to reduce your exposure to pollen during allergy  seasons.    Do not hang sheets or clothing out to dry; pollen may collect on these items. Do not mow lawns or spend time around freshly cut grass; mowing stirs up pollen. Keep windows closed at night.  Keep car windows closed while driving. Minimize morning activities outdoors, a time when pollen counts are usually at their highest. Stay indoors as much as possible when pollen counts or humidity is high and on windy days when pollen tends to remain in the air longer. Use air conditioning when possible.  Many air conditioners have filters that trap the pollen spores. Use a HEPA room air filter to remove pollen form the indoor air you breathe.  Control of Mold Allergen   Mold and fungi can grow on a variety of surfaces provided certain temperature and moisture conditions exist.  Outdoor molds grow on plants, decaying vegetation and soil.  The major outdoor mold, Alternaria and Cladosporium, are found in very high numbers during hot and dry conditions.  Generally, a late Summer - Fall peak is  seen for common outdoor fungal spores.  Rain will temporarily lower outdoor mold spore count, but counts rise rapidly when the rainy period ends.  The most important indoor molds are Aspergillus and Penicillium.  Dark, humid and poorly ventilated basements are ideal sites for mold growth.  The next most common sites of mold growth are the bathroom and the kitchen.  Outdoor (Seasonal) Mold Control  Use air conditioning and keep windows closed Avoid exposure to decaying vegetation. Avoid leaf raking. Avoid grain handling. Consider wearing a face mask if working in moldy areas.    Indoor (Perennial) Mold Control   Maintain humidity below 50%. Clean washable surfaces with 5% bleach solution. Remove sources e.g. contaminated carpets.

## 2024-06-23 NOTE — Progress Notes (Signed)
 FOLLOW UP Date of Service/Encounter:  06/23/24  Subjective:  Andrea Hunt (DOB: 18-Oct-1976) is a 48 y.o. female who [facility] in re-evaluation of the following: rhinitis and urticaria  History obtained from: chart review and patient.  For Review, LV was on 05/22/24  with Dr. Lorin seen for skin testing . See below for summary of history and diagnostics.   ----------------------------------------------------- Pertinent History/Diagnostics:  Allergic Rhinitis:  - sneezing, pruritus, and nasal congestion, exacerbated in fall.  - SPT environmental panel (05/22/24); grass, mold  Urticaria:  Onset 2024, occurs monthly, last 24 hours  Rx: pepcid 20mg  daily, montelukast , xyzal BID  -Labs (05/22/24): tryptase 11.9, ESR 44  --------------------------------------------------- Today presents for follow-up. Discussed the use of AI scribe software for clinical note transcription with the patient, who gave verbal consent to proceed.  History of Present Illness Andrea Hunt is a 47 year old female with chronic hives who presents for follow-up of her condition.  Urticaria - Chronic hives with two recent episodes: - First episode occurred after missing a dose of medication, resulting in a single hive - Second episode occurred this morning with a new hive on the heel despite regular medication use - Addition of a second Xyzal has provided significant improvement - Currently taking Xyzal, Pepcid, and montelukast  - No side effects from Xyzal, including no sleepiness - satisfied with current level of care   Sinus headache - Sinus headaches present over the past week - Headaches attributed to environmental factors rather than medication - Belief that poor sleep may be contributing to headaches  Sleep disturbance and restless leg syndrome - History of restless leg syndrome with significant impact on sleep - Previous medications for restless  leg syndrome resulted in hives, raising suspicion for drug allergy  - Poor sleep over the past week, believed to contribute to headaches and hives     All medications reviewed by clinical staff and updated in chart. No new pertinent medical or surgical history except as noted in HPI.  ROS: All others negative except as noted per HPI.   Objective:  BP 110/68 (BP Location: Right Arm, Patient Position: Sitting)   Pulse 81   Temp (!) 96.4 F (35.8 C) (Temporal)   Resp 20   Wt 232 lb 11.2 oz (105.6 kg)   LMP 06/05/2018 (Exact Date)   SpO2 95%   BMI 39.33 kg/m  Body mass index is 39.33 kg/m. Physical Exam: General Appearance:  Alert, cooperative, no distress, appears stated age  Head:  Normocephalic, without obvious abnormality, atraumatic  Eyes:  Conjunctiva clear, EOM's intact  Ears EACs normal bilaterally  Nose: Nares normal, no rhinnorrhea   Throat: Lips, tongue normal; teeth and gums normal, MMM  Neck: Supple, symmetrical  Lungs:    , Respirations unlabored, no coughing  Heart:   , Appears well perfused  Extremities: No edema  Skin: Skin color, texture, turgor normal and no rashes or lesions on visualized portions of skin  Neurologic: No gross deficits   Labs:  Lab Orders  No laboratory test(s) ordered today     Assessment/Plan   Patient Instructions  Allergy  Test (05/22/24); grass, mold   Chronic Idiopathic Urticaria: Labs (05/22/24): tryptase 11.9, ESR 44 - Continue  xyzal to 5mg  twice daily  - Continue pepcid 20mg  daily  - Continue montelukast  10mg  at night  - if hives are still uncontrolled with the above regimen, please arrange an appointment for discussion of Xolair (omalizumab)- an injectable  medication for hives  Seasonal allergic rhinitis Seasonal allergic rhinitis with sneezing, pruritus, and nasal congestion, exacerbated in fall.  - Continue montelukast  and Xyzal for symptom management.   Multiple Drug Allergies  - Okay to retry restless leg  medications as it is unlikely these were true drug allergies but exacerbations of your underlying chronic hives   Follow up: in 3 months   Reducing Pollen Exposure  The American Academy of Allergy , Asthma and Immunology suggests the following steps to reduce your exposure to pollen during allergy  seasons.    Do not hang sheets or clothing out to dry; pollen may collect on these items. Do not mow lawns or spend time around freshly cut grass; mowing stirs up pollen. Keep windows closed at night.  Keep car windows closed while driving. Minimize morning activities outdoors, a time when pollen counts are usually at their highest. Stay indoors as much as possible when pollen counts or humidity is high and on windy days when pollen tends to remain in the air longer. Use air conditioning when possible.  Many air conditioners have filters that trap the pollen spores. Use a HEPA room air filter to remove pollen form the indoor air you breathe.  Control of Mold Allergen   Mold and fungi can grow on a variety of surfaces provided certain temperature and moisture conditions exist.  Outdoor molds grow on plants, decaying vegetation and soil.  The major outdoor mold, Alternaria and Cladosporium, are found in very high numbers during hot and dry conditions.  Generally, a late Summer - Fall peak is seen for common outdoor fungal spores.  Rain will temporarily lower outdoor mold spore count, but counts rise rapidly when the rainy period ends.  The most important indoor molds are Aspergillus and Penicillium.  Dark, humid and poorly ventilated basements are ideal sites for mold growth.  The next most common sites of mold growth are the bathroom and the kitchen.  Outdoor (Seasonal) Mold Control  Use air conditioning and keep windows closed Avoid exposure to decaying vegetation. Avoid leaf raking. Avoid grain handling. Consider wearing a face mask if working in moldy areas.    Indoor (Perennial) Mold Control    Maintain humidity below 50%. Clean washable surfaces with 5% bleach solution. Remove sources e.g. contaminated carpets.       Other:    Thank you so much for letting me partake in your care today.  Don't hesitate to reach out if you have any additional concerns!  Hargis Springer, MD  [facility] [place], [place]

## 2024-07-06 ENCOUNTER — Encounter: Payer: Self-pay | Admitting: Internal Medicine

## 2024-07-07 NOTE — Telephone Encounter (Signed)
 Increase xyzal to 10mg  twice daily.  If still breakin out in hives after 1-2 weeks schedule an appointment to discuss xolair

## 2024-07-14 NOTE — Telephone Encounter (Signed)
 My chart message sent

## 2024-07-23 ENCOUNTER — Ambulatory Visit (INDEPENDENT_AMBULATORY_CARE_PROVIDER_SITE_OTHER): Admitting: Internal Medicine

## 2024-07-23 VITALS — BP 108/76 | HR 85 | Temp 98.1°F | Resp 17 | Wt 230.8 lb

## 2024-07-23 DIAGNOSIS — L501 Idiopathic urticaria: Secondary | ICD-10-CM

## 2024-07-23 DIAGNOSIS — Z889 Allergy status to unspecified drugs, medicaments and biological substances status: Secondary | ICD-10-CM

## 2024-07-23 DIAGNOSIS — H1045 Other chronic allergic conjunctivitis: Secondary | ICD-10-CM

## 2024-07-23 DIAGNOSIS — Z720 Tobacco use: Secondary | ICD-10-CM

## 2024-07-23 DIAGNOSIS — J3089 Other allergic rhinitis: Secondary | ICD-10-CM

## 2024-07-23 DIAGNOSIS — J302 Other seasonal allergic rhinitis: Secondary | ICD-10-CM

## 2024-07-23 MED ORDER — OMALIZUMAB 300 MG/2  ML ~~LOC~~ SOSY
300.0000 mg | PREFILLED_SYRINGE | Freq: Once | SUBCUTANEOUS | Status: AC
  Administered 2024-07-23: 300 mg via SUBCUTANEOUS

## 2024-07-23 MED ORDER — EPINEPHRINE 0.3 MG/0.3ML IJ SOAJ: 0.3000 mg | INTRAMUSCULAR | 1 refills | Status: AC | PRN

## 2024-07-23 NOTE — Patient Instructions (Addendum)
 Allergy  Test (05/22/24); grass, mold   Chronic Idiopathic Urticaria: not controlled  Labs (05/22/24): tryptase 11.9, ESR 44 - Continue  xyzal to 5mg  twice daily  - Continue pepcid 20mg  daily  - Continue montelukast  10mg  at night  - Start xolair  300mg  every 4 weeks   - informed consent obtained today   - started with sample in clinic   - Tammy our biologic coordinator will reach out to you in regards to approval process   Seasonal allergic rhinitis Seasonal allergic rhinitis with sneezing, pruritus, and nasal congestion, exacerbated in fall.  - Continue montelukast  and Xyzal for symptom management as above - Use flonase  1 spray per nostril twice daily as needed for nasal congestion    Multiple Drug Allergies  - Okay to retry restless leg medications as it is unlikely these were true drug allergies but exacerbations of your underlying chronic hives   Follow up: in 3 months

## 2024-07-23 NOTE — Progress Notes (Signed)
 FOLLOW UP Date of Service/Encounter:  07/23/24  Subjective:  Andrea Hunt (DOB: [birth year]) is a 48 y.o. female who [facility] in re-evaluation of the following: rhinitis and urticaria  History obtained from: chart review and patient.  For Review, LV was on 06/23/24  with Dr. Lorin seen for routine follow-up. See below for summary of history and diagnostics.   ----------------------------------------------------- Pertinent History/Diagnostics:  Allergic Rhinitis:  - sneezing, pruritus, and nasal congestion, exacerbated in fall.  - SPT environmental panel (05/22/24); grass, mold  Urticaria:  Onset 2024, occurs monthly, last 24 hours  Rx: pepcid 20mg  daily, montelukast , xyzal BID  -Labs (05/22/24): tryptase 11.9, ESR 44 - Xolair  300mg  started 07/23/24   --------------------------------------------------- Today presents for follow-up. Discussed the use of AI scribe software for clinical note transcription with the patient, who gave verbal consent to proceed.  History of Present Illness Andrea Hunt is a 48 year old female with chronic hives who presents for initiation of Xolair  treatment.  Urticaria (chronic hives) - Chronic hives with recent severe breakout involving arms and other areas - Breakout resulted in bruising from scratching - No identifiable trigger for recent episode; occurred after bathing and grooming dogs - No recent illness or stressors prior to breakout - No current nasal symptoms - No current angioedema or anaphylaxis  Medication use and adverse effects - Currently taking Xyzal, four pills daily (two in the morning, two at night), and Singulair  - Increased Xyzal dosage during recent breakout as previously advised - Concerned about sedative effects of Xyzal and considering dose reduction - Has not started 'leg jumping medicine' due to concern for allergic reaction during breakout - Uses Flonase  as needed  for allergies - Interested in starting xolair .     All medications reviewed by clinical staff and updated in chart. No new pertinent medical or surgical history except as noted in HPI.  ROS: All others negative except as noted per HPI.   Objective:  BP 108/76   Pulse 85   Temp 98.1 F (36.7 C) (Temporal)   Resp 17   Wt 230 lb 12.8 oz (104.7 kg)   LMP 06/05/2018 (Exact Date)   SpO2 97%   BMI 39.00 kg/m  Body mass index is 39 kg/m. Physical Exam: General Appearance:  Alert, cooperative, no distress, appears stated age  Head:  Normocephalic, without obvious abnormality, atraumatic  Eyes:  Conjunctiva clear, EOM's intact  Ears EACs normal bilaterally  Nose: Nares normal, no rhinnorrhea   Throat: Lips, tongue normal; teeth and gums normal, MMM  Neck: Supple, symmetrical  Lungs:    , Respirations unlabored, no coughing  Heart:   , Appears well perfused  Extremities: No edema  Skin: Scattered erythematous macules  and Skin color, texture, turgor normal  Neurologic: No gross deficits   Labs:  Lab Orders  No laboratory test(s) ordered today     Assessment/Plan   Patient Instructions  Allergy  Test (05/22/24); grass, mold   Chronic Idiopathic Urticaria: not controlled  Labs (05/22/24): tryptase 11.9, ESR 44 - Continue  xyzal to 5mg  twice daily  - Continue pepcid 20mg  daily  - Continue montelukast  10mg  at night  - Start xolair  300mg  every 4 weeks   - informed consent obtained today   - started with sample in clinic   - Tammy our biologic coordinator will reach out to you in regards to approval process   Seasonal allergic rhinitis Seasonal allergic rhinitis with sneezing, pruritus, and nasal  congestion, exacerbated in fall.  - Continue montelukast  and Xyzal for symptom management as above - Use flonase  1 spray per nostril twice daily as needed for nasal congestion    Multiple Drug Allergies  - Okay to retry restless leg medications as it is unlikely these were true  drug allergies but exacerbations of your underlying chronic hives   Follow up: in 3 months   Other:    Thank you so much for letting me partake in your care today.  Don't hesitate to reach out if you have any additional concerns!  Hargis Springer, MD  [facility] [place], [place]

## 2024-07-23 NOTE — Progress Notes (Signed)
 Immunotherapy   Patient Details  Name: Andrea Hunt MRN: [id number] Date of Birth: 1976/07/31  07/23/2024  Andrea Hunt was started on Xolair  for Chronic Hives.   *** Frequency: every 4 weeks   Epi-Pen:Epi-Pen Available  Consent signed and patient instructions given.   Andrea Hunt 07/23/2024, 4:44 PM

## 2024-08-01 ENCOUNTER — Telehealth: Payer: Self-pay | Admitting: *Deleted

## 2024-08-01 NOTE — Telephone Encounter (Signed)
 Called patient and advised approval, copay card and submit to [employer] for XOlair . Will reach out to patient once delivery set to make appt for next injection

## 2024-08-01 NOTE — Telephone Encounter (Signed)
-----   Message from Hargis Springer sent at 07/28/2024  8:29 AM EDT ----- I would like to start this patien ton xolair  300mg  every 4 weeks for chronic urticaria. Thanks!!

## 2024-08-19 ENCOUNTER — Ambulatory Visit (INDEPENDENT_AMBULATORY_CARE_PROVIDER_SITE_OTHER)

## 2024-08-19 DIAGNOSIS — L501 Idiopathic urticaria: Secondary | ICD-10-CM

## 2024-08-19 MED ORDER — OMALIZUMAB 300 MG/2  ML ~~LOC~~ SOSY
300.0000 mg | PREFILLED_SYRINGE | SUBCUTANEOUS | Status: AC
  Administered 2024-08-19 – 2024-12-15 (×5): 300 mg via SUBCUTANEOUS

## 2024-09-18 ENCOUNTER — Ambulatory Visit (INDEPENDENT_AMBULATORY_CARE_PROVIDER_SITE_OTHER)

## 2024-09-18 DIAGNOSIS — L501 Idiopathic urticaria: Secondary | ICD-10-CM

## 2024-09-23 ENCOUNTER — Ambulatory Visit: Admitting: Internal Medicine

## 2024-10-01 ENCOUNTER — Ambulatory Visit: Admitting: Internal Medicine

## 2024-10-20 ENCOUNTER — Ambulatory Visit

## 2024-10-20 ENCOUNTER — Other Ambulatory Visit: Payer: Self-pay

## 2024-10-20 ENCOUNTER — Ambulatory Visit (INDEPENDENT_AMBULATORY_CARE_PROVIDER_SITE_OTHER): Admitting: Internal Medicine

## 2024-10-20 VITALS — BP 126/84 | HR 93 | Temp 98.1°F | Resp 20 | Wt 232.2 lb

## 2024-10-20 DIAGNOSIS — L501 Idiopathic urticaria: Secondary | ICD-10-CM | POA: Diagnosis not present

## 2024-10-20 DIAGNOSIS — Z889 Allergy status to unspecified drugs, medicaments and biological substances status: Secondary | ICD-10-CM | POA: Diagnosis not present

## 2024-10-20 DIAGNOSIS — J302 Other seasonal allergic rhinitis: Secondary | ICD-10-CM

## 2024-10-20 DIAGNOSIS — Z720 Tobacco use: Secondary | ICD-10-CM | POA: Diagnosis not present

## 2024-10-20 DIAGNOSIS — J3089 Other allergic rhinitis: Secondary | ICD-10-CM

## 2024-10-20 NOTE — Patient Instructions (Addendum)
 Allergy  Test (05/22/24); grass, mold   Chronic Idiopathic Urticaria: not controlled  Labs (05/22/24): tryptase 11.9, ESR 44 - Continue  xyzal to 5mg  twice daily as needed  - STOP pepcid 20mg  daily as needed - STOP montelukast  10mg  at night  - Continue xolair  300mg  every 4 weeks   Seasonal allergic rhinitis Seasonal allergic rhinitis with sneezing, pruritus, and nasal congestion, exacerbated in fall.  - Continue Xyzal for symptom management as above - Use flonase  1 spray per nostril twice daily as needed for nasal congestion    Multiple Drug Allergies  - successfully introduce RLS drug  - we can look at introducing drugs as needed, many of these reactions were likely no drug mediated but related to underlying hives    Follow up: in 6 months

## 2024-10-20 NOTE — Progress Notes (Signed)
 FOLLOW UP Date of Service/Encounter:  10/20/24  Subjective:  Andrea Hunt (DOB: 06/15/1976) is a 48 y.o. female who [facility]24 in re-evaluation of the following: rhinitis and urticaria  History obtained from: chart review and patient.  For Review, LV was on 07/23/24  with Dr. Lorin seen for routine follow-up. See below for summary of history and diagnostics.   ----------------------------------------------------- Pertinent History/Diagnostics:  Allergic Rhinitis:  - sneezing, pruritus, and nasal congestion, exacerbated in fall.  - SPT environmental panel (05/22/24); grass, mold  Urticaria:  Onset 2024, occurs monthly, last 24 hours  Rx: pepcid 20mg  daily, montelukast , xyzal BID  -Labs (05/22/24): tryptase 11.9, ESR 44 - Xolair  300mg  started 07/23/24   --------------------------------------------------- Today presents for follow-up. Discussed the use of AI scribe software for clinical note transcription with the patient, who gave verbal consent to proceed.  History of Present Illness Andrea Hunt is a 48 year old female with chronic hives who presents for follow-up on Xolair  treatment.  Chronic urticaria - Chronic hives previously present prior to Xolair  initiation - Receiving Xolair  injections for chronic urticaria - No hives since the first Xolair  injection - very happy with current level control  - Initially hesitant to start Xolair  due to needle phobia and potential side effects, especially in context of smoking and frequent air travel - No side effects experienced from Xolair   Allergic rhinitis - Uses nasal spray occasionally, particularly when exposed to environmental allergens such as certain beach houses - using xyzal as needed   Leukotriene receptor antagonist use - Continues daily montelukast  (Singulair ) - Questions efficacy of montelukast  for her symptoms  Musculoskeletal pain - [place], resulting in shoulder and elbow pain  Tobacco use and air travel - Smokes tobacco - Travels by air approximately once a month  All medications reviewed by clinical staff and updated in chart. No new pertinent medical or surgical history except as noted in HPI.  ROS: All others negative except as noted per HPI.   Objective:  BP 126/84   Pulse 93   Temp 98.1 F (36.7 C) (Temporal)   Resp 20   Wt 232 lb 3.2 oz (105.3 kg)   LMP 06/05/2018 (Exact Date)   SpO2 98%   BMI 39.24 kg/m  Body mass index is 39.24 kg/m. Physical Exam: General Appearance:  Alert, cooperative, no distress, appears stated age  Head:  Normocephalic, without obvious abnormality, atraumatic  Eyes:  Conjunctiva clear, EOM's intact  Ears EACs normal bilaterally  Nose: Nares normal, normal mucosa, no visible anterior polyps, and septum midline  Throat: Lips, tongue normal; teeth and gums normal, MMM  Neck: Supple, symmetrical  Lungs:   clear to auscultation bilaterally, Respirations unlabored, no coughing  Heart:  regular rate and rhythm and no murmur, Appears well perfused  Extremities: No edema  Skin: Skin color, texture, turgor normal and no rashes or lesions on visualized portions of skin  Neurologic: No gross deficits   Labs:  Lab Orders  No laboratory test(s) ordered today     Assessment/Plan   Patient Instructions  Allergy  Test (05/22/24); grass, mold   Chronic Idiopathic Urticaria: not controlled  Labs (05/22/24): tryptase 11.9, ESR 44 - Continue  xyzal to 5mg  twice daily as needed  - STOP pepcid 20mg  daily as needed - STOP montelukast  10mg  at night  - Continue xolair  300mg  every 4 weeks   Seasonal allergic rhinitis Seasonal allergic rhinitis  with sneezing, pruritus, and nasal congestion, exacerbated in fall.  - Continue Xyzal for symptom management as above - Use flonase  1 spray per nostril twice daily as needed for nasal congestion    Multiple Drug  Allergies  - successfully introduce RLS drug  - we can look at introducing drugs as needed, many of these reactions were likely no drug mediated but related to underlying hives    Follow up: in 6 months   Other:    Thank you so much for letting me partake in your care today.  Don't hesitate to reach out if you have any additional concerns!  Hargis Springer, MD  [facility] [place], [place]

## 2024-11-17 ENCOUNTER — Ambulatory Visit

## 2024-11-17 DIAGNOSIS — L501 Idiopathic urticaria: Secondary | ICD-10-CM

## 2024-11-19 ENCOUNTER — Ambulatory Visit

## 2024-12-15 ENCOUNTER — Ambulatory Visit

## 2024-12-15 DIAGNOSIS — L501 Idiopathic urticaria: Secondary | ICD-10-CM | POA: Diagnosis not present

## 2025-01-12 ENCOUNTER — Ambulatory Visit

## 2025-04-28 ENCOUNTER — Ambulatory Visit: Admitting: Internal Medicine
